# Patient Record
Sex: Male | Born: 1975 | Race: Black or African American | Hispanic: No | Marital: Single | Smoking: Current every day smoker
Health system: Southern US, Community
[De-identification: ages and names within clinical notes are randomized; demographics above are authoritative.]

## PROBLEM LIST (undated history)

## (undated) DIAGNOSIS — K859 Acute pancreatitis without necrosis or infection, unspecified: Secondary | ICD-10-CM

## (undated) HISTORY — PX: NO PAST SURGERIES: SHX2092

## (undated) HISTORY — PX: ANKLE FRACTURE SURGERY: SHX122

---

## 2005-11-19 ENCOUNTER — Inpatient Hospital Stay (HOSPITAL_COMMUNITY): Admission: EM | Admit: 2005-11-19 | Discharge: 2005-11-22 | Payer: Self-pay | Admitting: Emergency Medicine

## 2006-04-26 ENCOUNTER — Inpatient Hospital Stay (HOSPITAL_COMMUNITY): Admission: AD | Admit: 2006-04-26 | Discharge: 2006-04-28 | Payer: Self-pay | Admitting: Internal Medicine

## 2006-06-14 ENCOUNTER — Emergency Department (HOSPITAL_COMMUNITY): Admission: EM | Admit: 2006-06-14 | Discharge: 2006-06-14 | Payer: Self-pay | Admitting: Emergency Medicine

## 2006-07-10 ENCOUNTER — Inpatient Hospital Stay (HOSPITAL_COMMUNITY): Admission: EM | Admit: 2006-07-10 | Discharge: 2006-07-13 | Payer: Self-pay | Admitting: Emergency Medicine

## 2006-07-10 ENCOUNTER — Ambulatory Visit: Payer: Self-pay | Admitting: Hospitalist

## 2006-10-09 ENCOUNTER — Emergency Department (HOSPITAL_COMMUNITY): Admission: EM | Admit: 2006-10-09 | Discharge: 2006-10-09 | Payer: Self-pay | Admitting: Emergency Medicine

## 2006-11-24 ENCOUNTER — Inpatient Hospital Stay (HOSPITAL_COMMUNITY): Admission: EM | Admit: 2006-11-24 | Discharge: 2006-11-28 | Payer: Self-pay | Admitting: Emergency Medicine

## 2007-05-09 ENCOUNTER — Emergency Department (HOSPITAL_COMMUNITY): Admission: EM | Admit: 2007-05-09 | Discharge: 2007-05-09 | Payer: Self-pay | Admitting: Emergency Medicine

## 2007-05-11 ENCOUNTER — Inpatient Hospital Stay (HOSPITAL_COMMUNITY): Admission: EM | Admit: 2007-05-11 | Discharge: 2007-05-14 | Payer: Self-pay | Admitting: *Deleted

## 2007-05-18 ENCOUNTER — Emergency Department (HOSPITAL_COMMUNITY): Admission: EM | Admit: 2007-05-18 | Discharge: 2007-05-18 | Payer: Self-pay | Admitting: Emergency Medicine

## 2007-05-20 ENCOUNTER — Inpatient Hospital Stay (HOSPITAL_COMMUNITY): Admission: EM | Admit: 2007-05-20 | Discharge: 2007-05-26 | Payer: Self-pay | Admitting: Emergency Medicine

## 2007-08-29 ENCOUNTER — Inpatient Hospital Stay (HOSPITAL_COMMUNITY): Admission: EM | Admit: 2007-08-29 | Discharge: 2007-09-02 | Payer: Self-pay | Admitting: Emergency Medicine

## 2007-10-15 ENCOUNTER — Inpatient Hospital Stay (HOSPITAL_COMMUNITY): Admission: EM | Admit: 2007-10-15 | Discharge: 2007-10-18 | Payer: Self-pay | Admitting: Emergency Medicine

## 2008-02-09 ENCOUNTER — Inpatient Hospital Stay (HOSPITAL_COMMUNITY): Admission: EM | Admit: 2008-02-09 | Discharge: 2008-02-14 | Payer: Self-pay | Admitting: Emergency Medicine

## 2008-02-25 ENCOUNTER — Inpatient Hospital Stay (HOSPITAL_COMMUNITY): Admission: EM | Admit: 2008-02-25 | Discharge: 2008-03-04 | Payer: Self-pay | Admitting: Emergency Medicine

## 2008-02-29 ENCOUNTER — Encounter: Payer: Self-pay | Admitting: Internal Medicine

## 2008-03-10 ENCOUNTER — Ambulatory Visit: Payer: Self-pay | Admitting: Internal Medicine

## 2008-03-21 DIAGNOSIS — F1010 Alcohol abuse, uncomplicated: Secondary | ICD-10-CM

## 2008-03-21 DIAGNOSIS — I10 Essential (primary) hypertension: Secondary | ICD-10-CM | POA: Insufficient documentation

## 2008-03-21 DIAGNOSIS — K861 Other chronic pancreatitis: Secondary | ICD-10-CM | POA: Insufficient documentation

## 2008-03-21 DIAGNOSIS — F1910 Other psychoactive substance abuse, uncomplicated: Secondary | ICD-10-CM

## 2008-03-22 ENCOUNTER — Ambulatory Visit: Payer: Self-pay | Admitting: Gastroenterology

## 2008-04-06 ENCOUNTER — Inpatient Hospital Stay (HOSPITAL_COMMUNITY): Admission: EM | Admit: 2008-04-06 | Discharge: 2008-04-11 | Payer: Self-pay | Admitting: Emergency Medicine

## 2008-04-28 ENCOUNTER — Inpatient Hospital Stay (HOSPITAL_COMMUNITY): Admission: EM | Admit: 2008-04-28 | Discharge: 2008-05-06 | Payer: Self-pay | Admitting: Emergency Medicine

## 2008-07-28 ENCOUNTER — Emergency Department (HOSPITAL_COMMUNITY): Admission: EM | Admit: 2008-07-28 | Discharge: 2008-07-29 | Payer: Self-pay | Admitting: Emergency Medicine

## 2009-02-01 ENCOUNTER — Inpatient Hospital Stay (HOSPITAL_COMMUNITY): Admission: EM | Admit: 2009-02-01 | Discharge: 2009-02-03 | Payer: Self-pay | Admitting: Emergency Medicine

## 2009-08-13 IMAGING — CR DG CHEST 2V
2 series · 2 of 2 positions shown · non-contrast
Comparison: 05/20/2007

CLINICAL DATA: Pancreatitis

CHEST - 2 VIEW

[w chest pa]
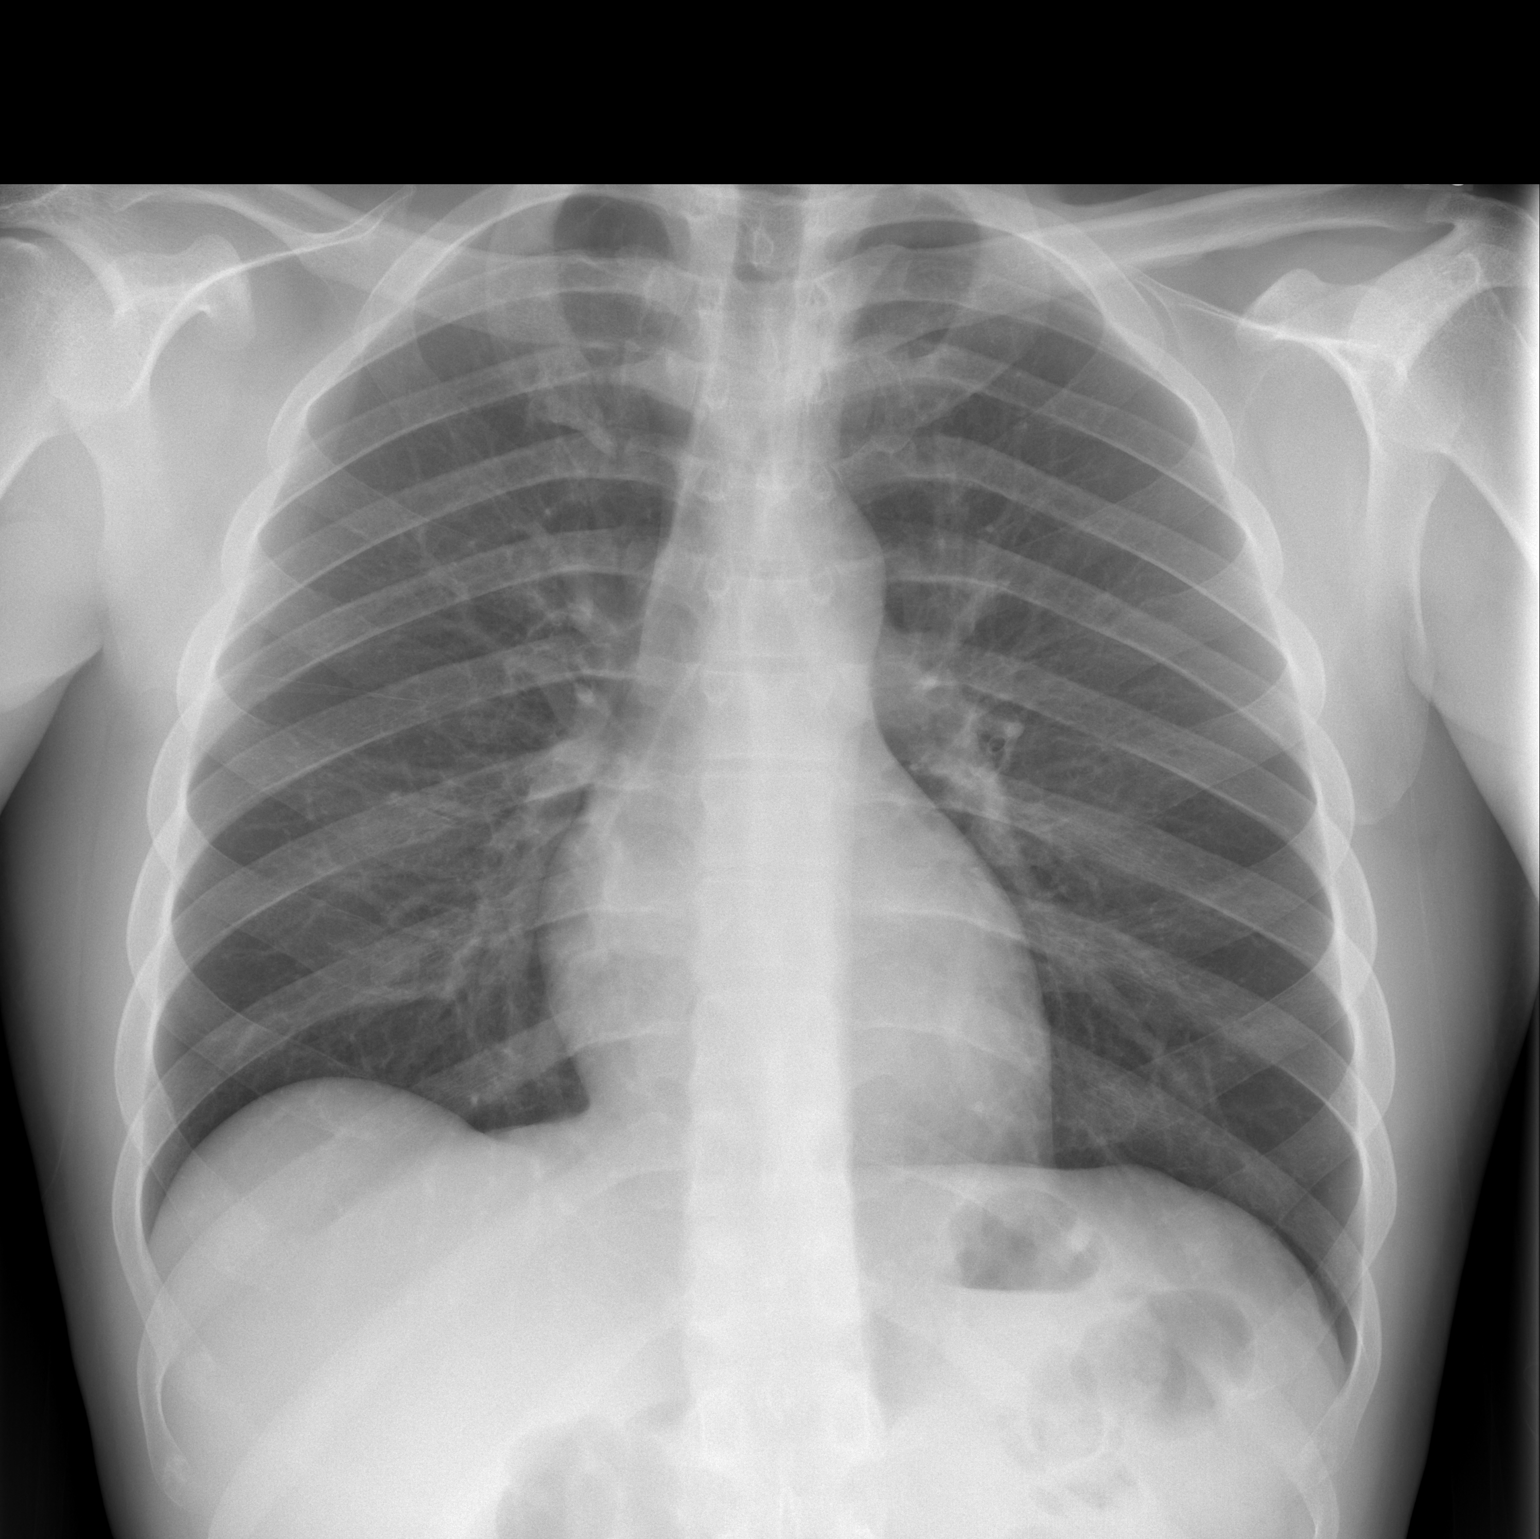

[w chest lat]
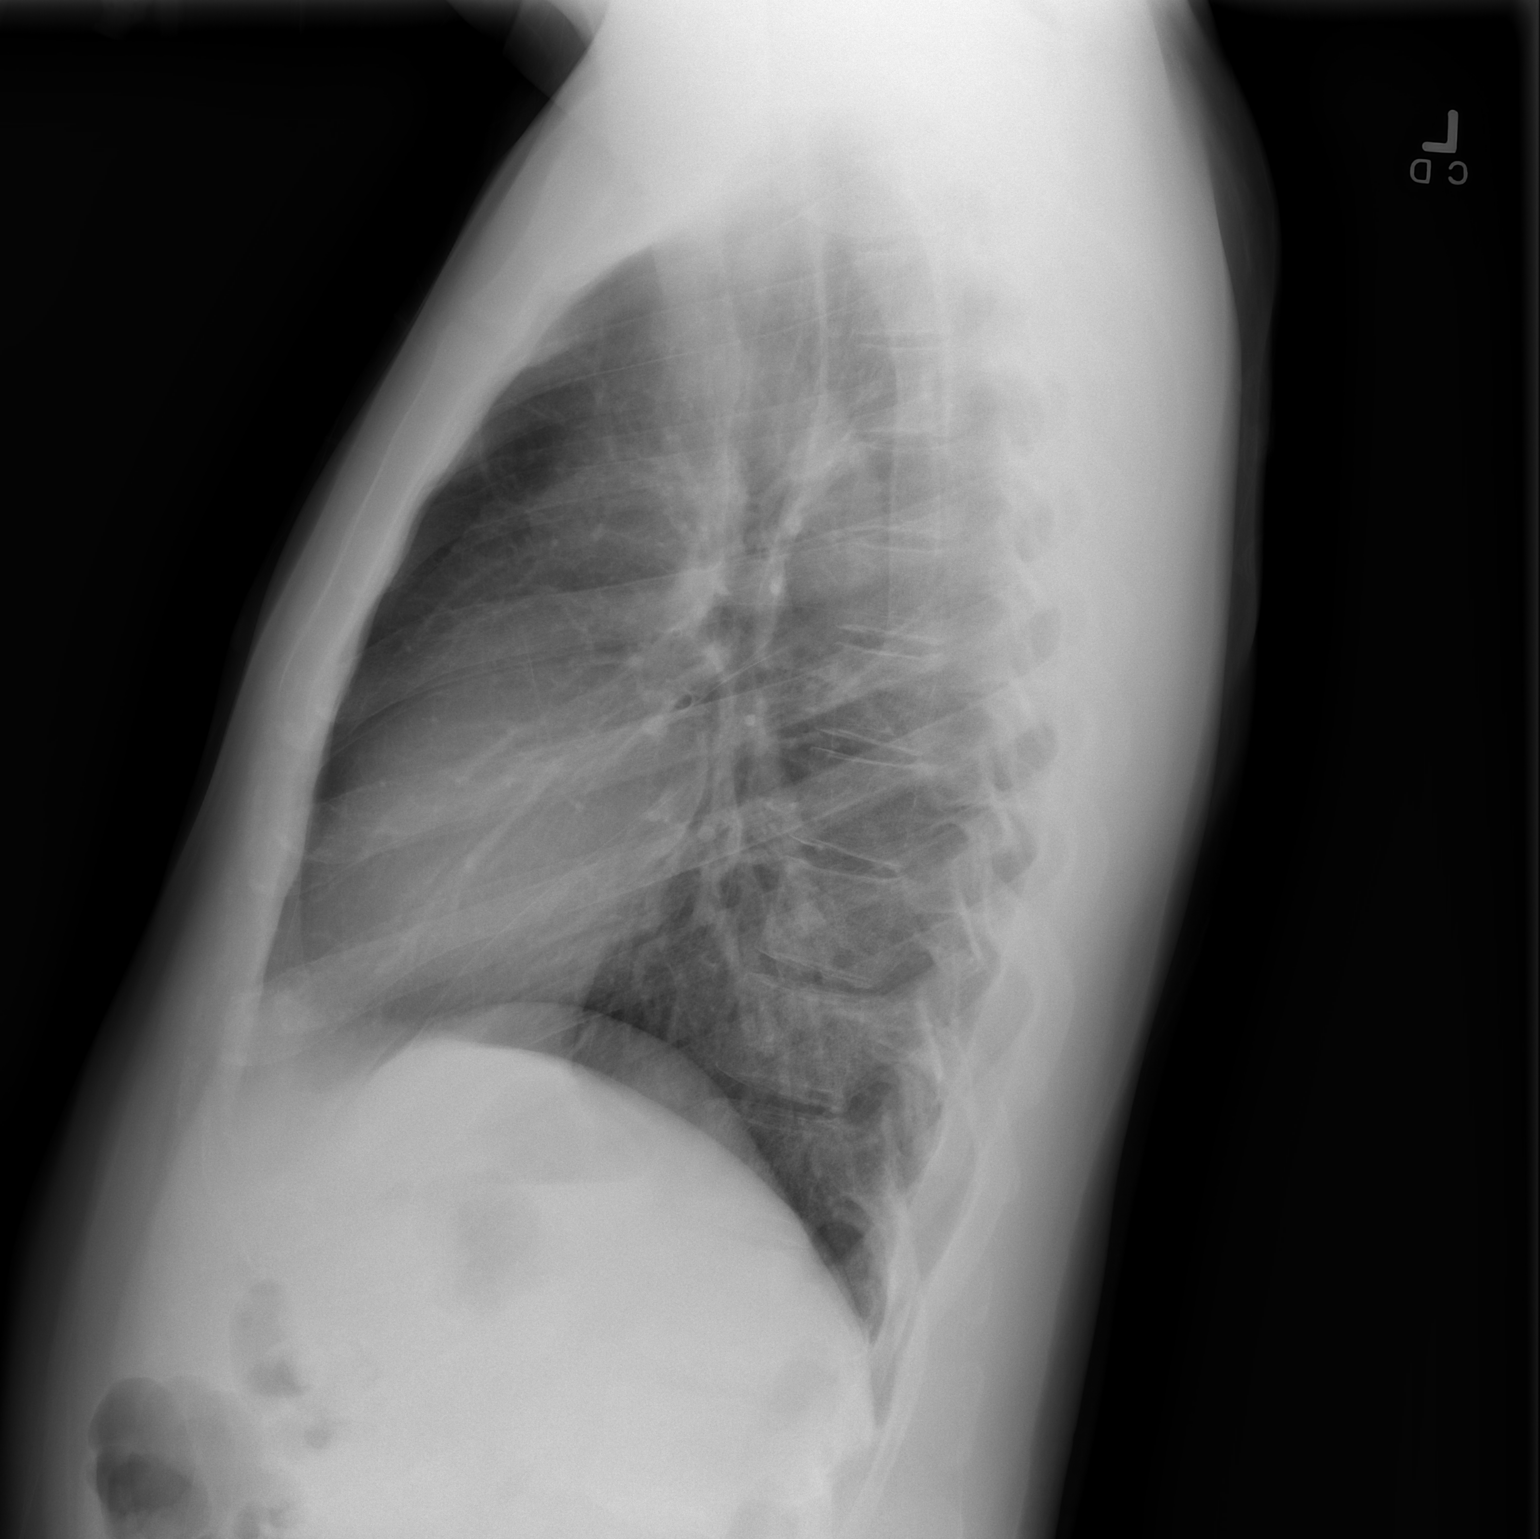

[2 of 2 positions shown; findings below may reference images not displayed]

FINDINGS: The cardiac silhouette, mediastinal and hilar contours
are within normal limits and stable.  The lungs are clear.  The
bony thorax is intact.
IMPRESSION: 1.  No acute cardiopulmonary findings.

## 2009-08-28 ENCOUNTER — Inpatient Hospital Stay (HOSPITAL_COMMUNITY): Admission: EM | Admit: 2009-08-28 | Discharge: 2009-08-31 | Payer: Self-pay | Admitting: Emergency Medicine

## 2009-09-11 IMAGING — RF DG ERCP WO/W SPHINCTEROTOMY
1 series · 6 of 6 positions shown · non-contrast
Comparison: Correlation is made with the CT scan dated May 01, 2008

CLINICAL DATA: Pancreatitis; suspect stone

ERCP

[Series 1: run · 6 of 6 slices shown]
[im 1/6]
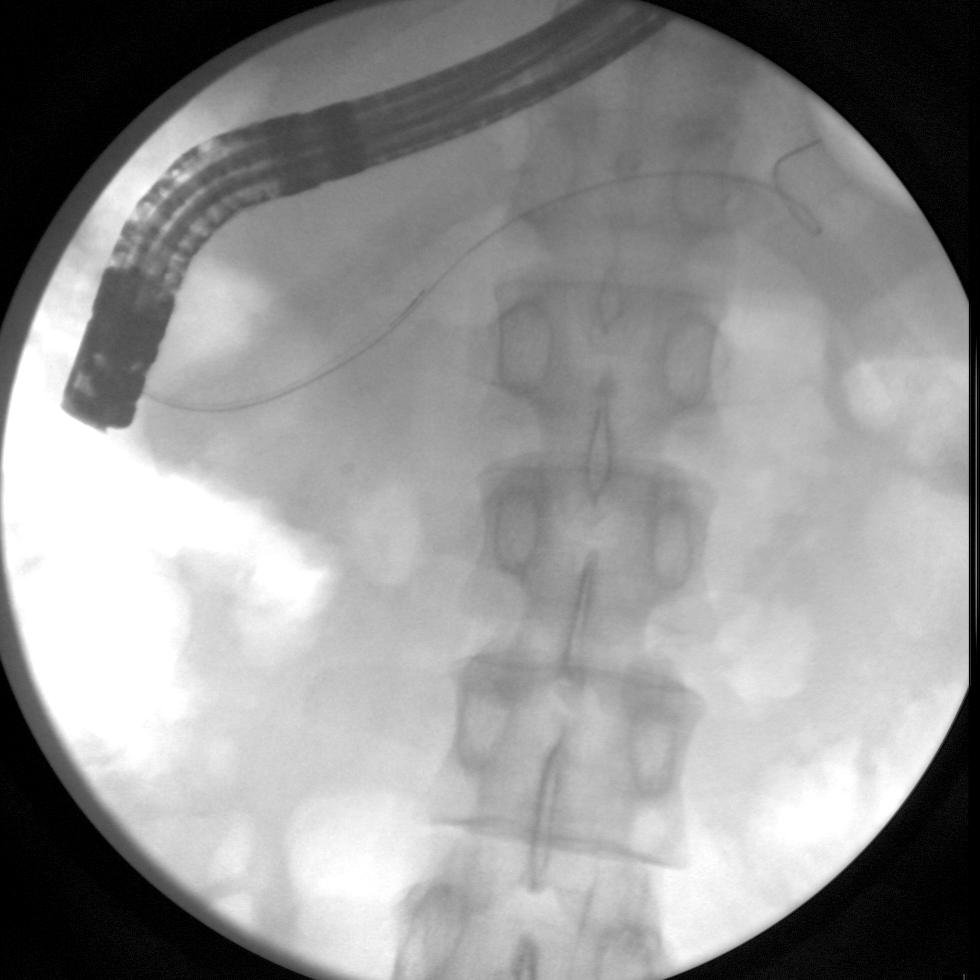
[im 2/6]
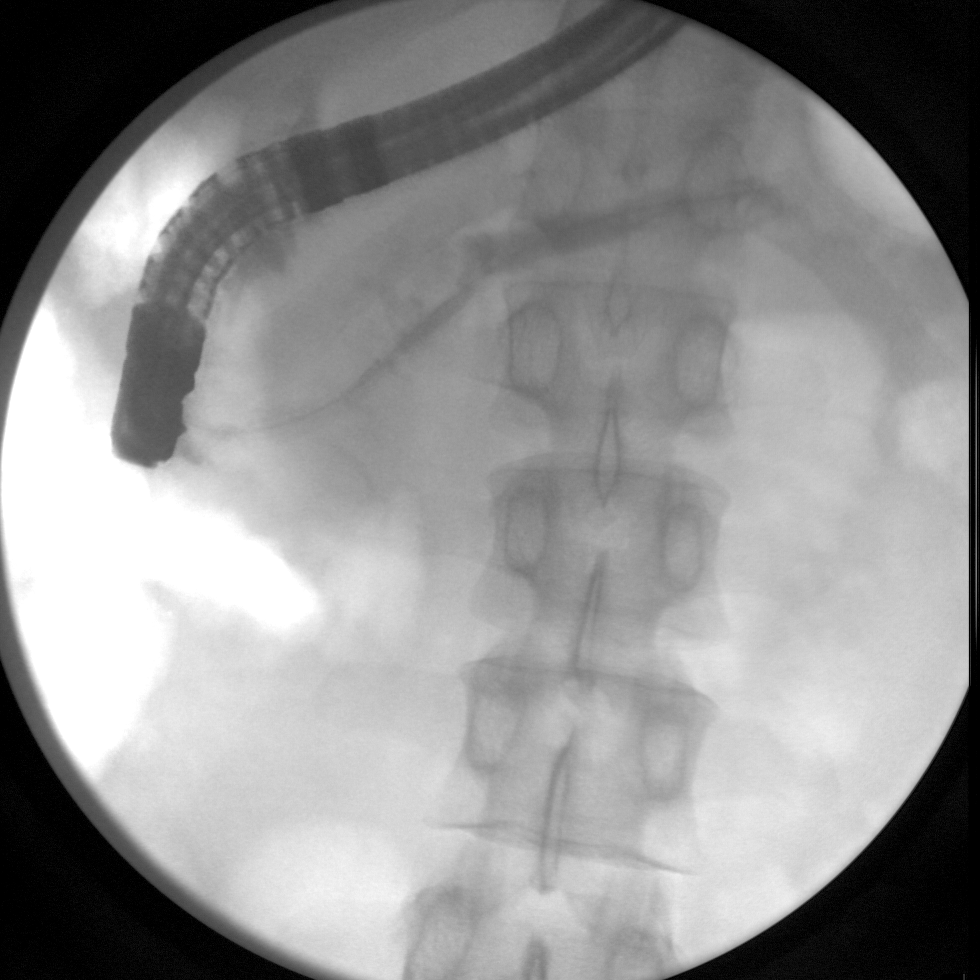
[im 3/6]
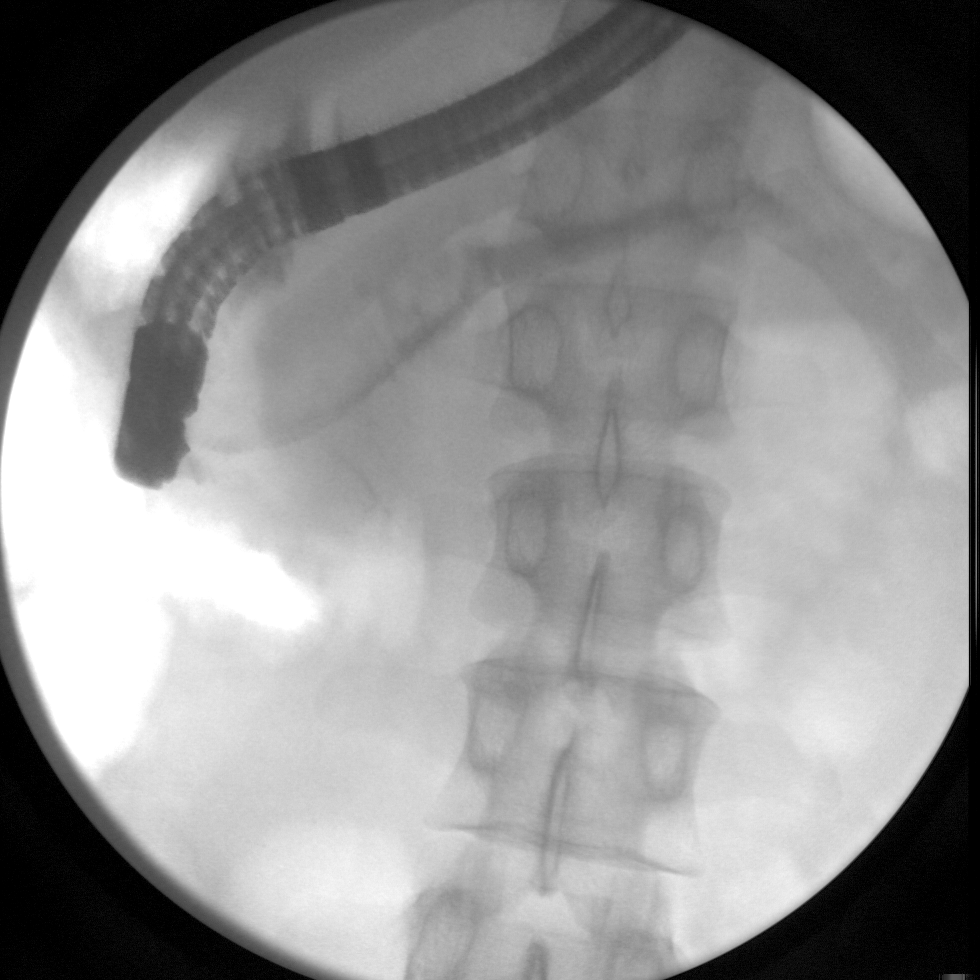
[im 4/6]
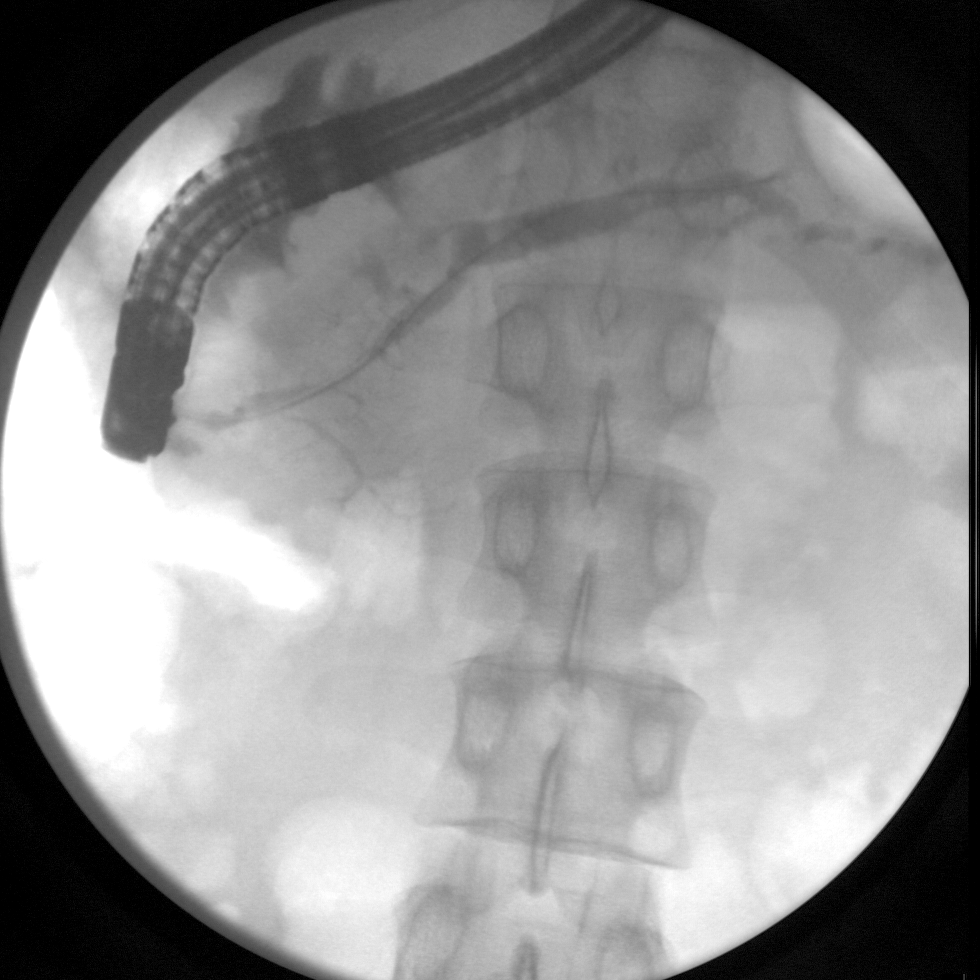
[im 5/6]
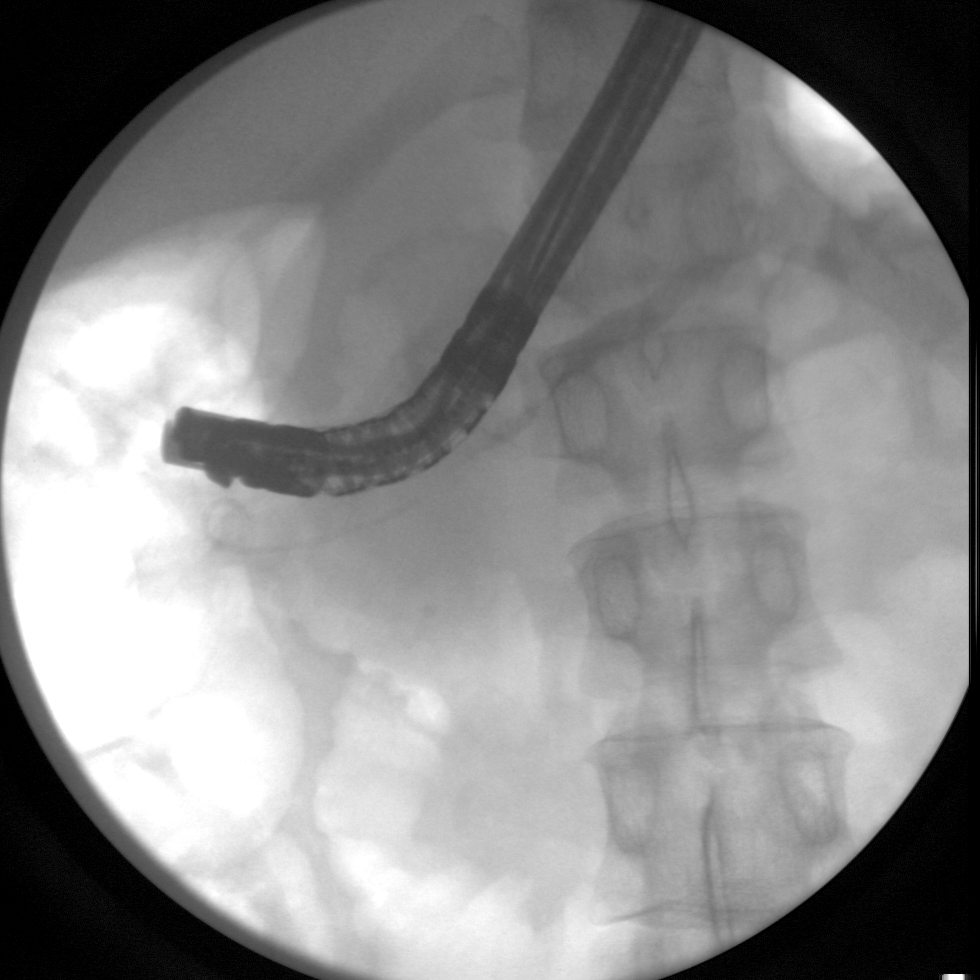
[im 6/6]
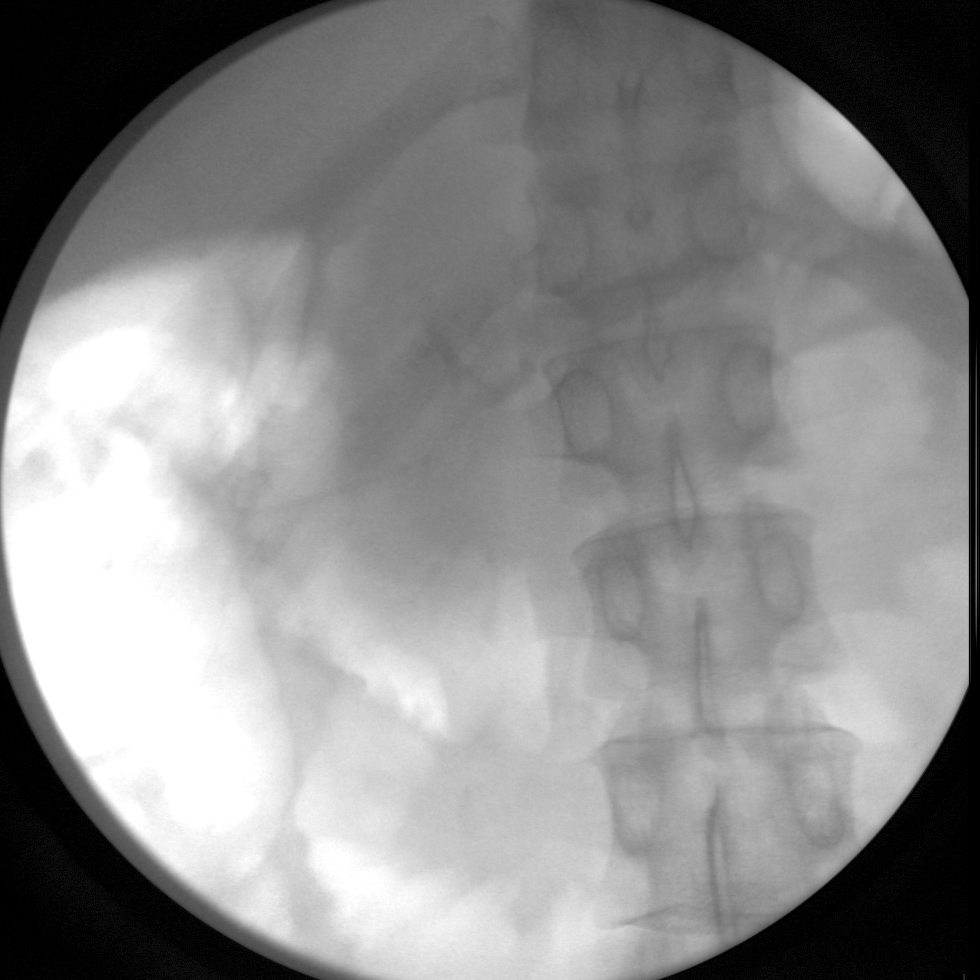

[6 of 6 positions shown; findings below may reference images not displayed]

FINDINGS: An oval filling defect is suspected within the proximal
pancreatic duct ; the pancreatic duct is dilated distal to this.
IMPRESSION: Fixed filling defect in the proximal pancreatic duct correlates
with the calculus seen on CT scan.

## 2009-09-20 ENCOUNTER — Inpatient Hospital Stay (HOSPITAL_COMMUNITY): Admission: EM | Admit: 2009-09-20 | Discharge: 2009-09-22 | Payer: Self-pay | Admitting: Emergency Medicine

## 2009-11-06 ENCOUNTER — Inpatient Hospital Stay (HOSPITAL_COMMUNITY): Admission: EM | Admit: 2009-11-06 | Discharge: 2009-11-09 | Payer: Self-pay | Admitting: Emergency Medicine

## 2009-12-25 ENCOUNTER — Emergency Department (HOSPITAL_COMMUNITY): Admission: EM | Admit: 2009-12-25 | Discharge: 2009-12-25 | Payer: Self-pay | Admitting: Emergency Medicine

## 2010-03-04 ENCOUNTER — Emergency Department (HOSPITAL_COMMUNITY): Admission: EM | Admit: 2010-03-04 | Discharge: 2010-03-04 | Payer: Self-pay | Admitting: Emergency Medicine

## 2010-06-10 IMAGING — CR DG ABDOMEN 2V
2 series · 2 of 2 positions shown · non-contrast
Comparison: 05/01/2008

CLINICAL DATA: Abdominal pain

ABDOMEN - 2 VIEW

[w abdomen upright *]
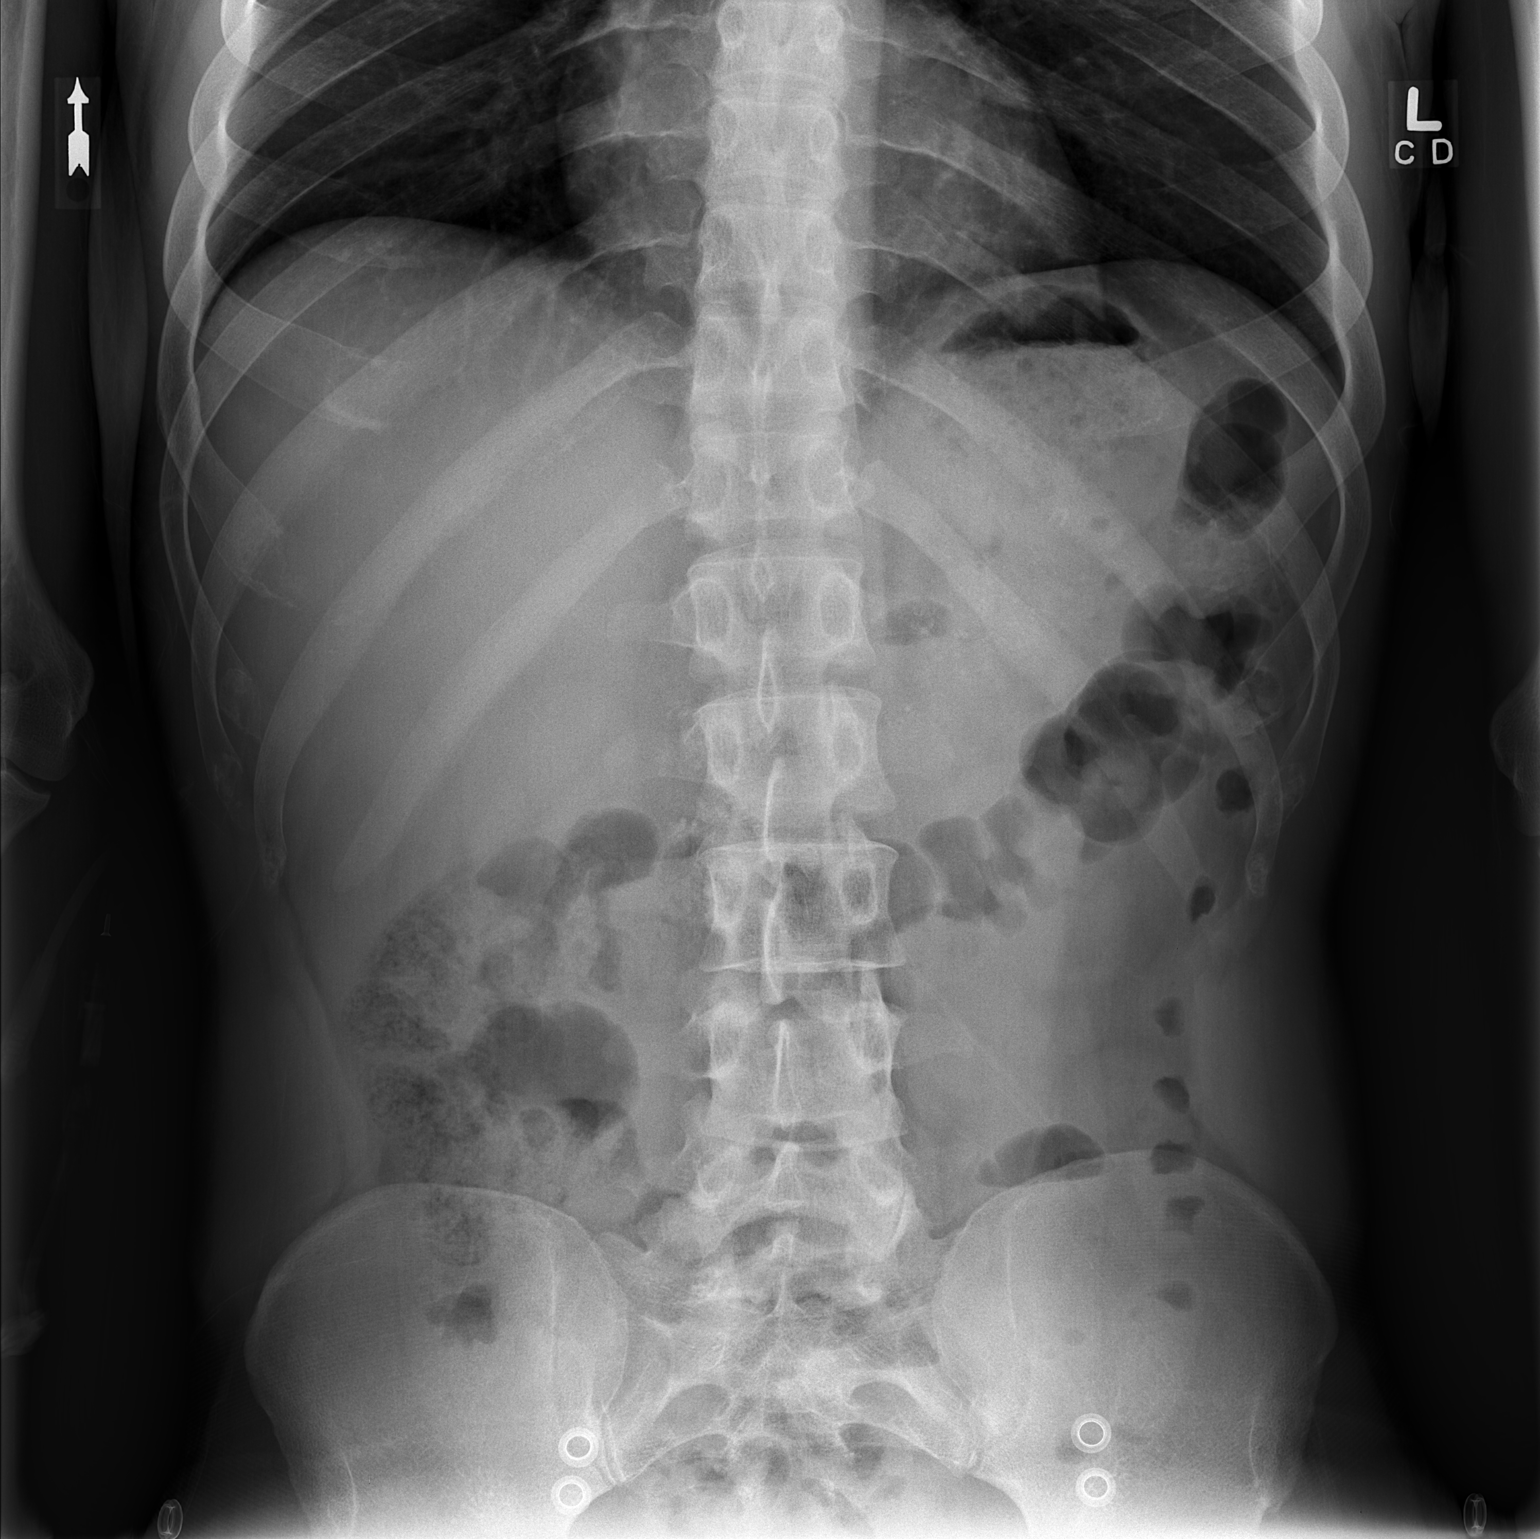

[t abdomen supine]
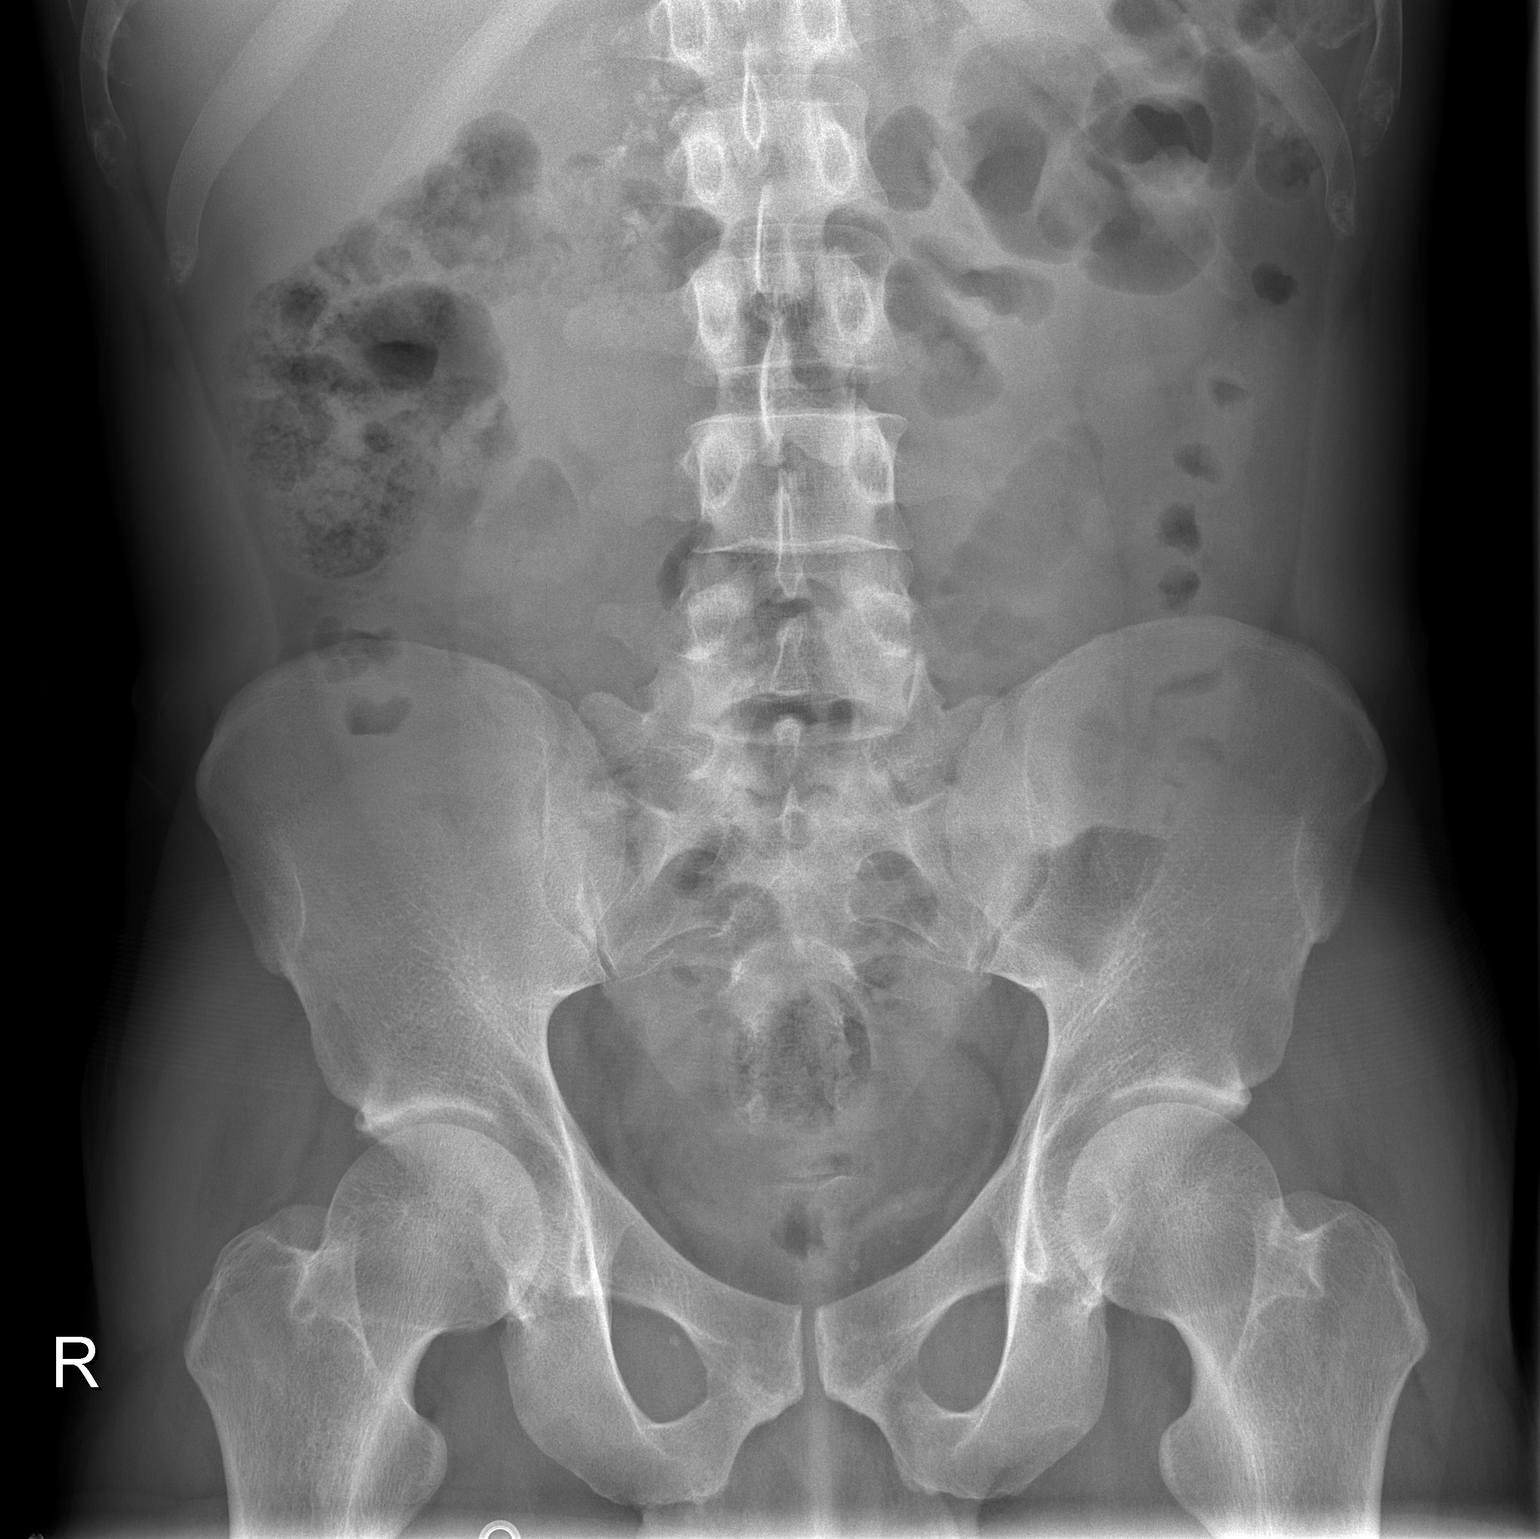

[2 of 2 positions shown; findings below may reference images not displayed]

FINDINGS: Bowel gas pattern is normal without evidence of ileus,
obstruction or free air.  Chronic pancreatic calcifications remain
evident.  No other soft tissue calcifications of note.  Bony
structures are unremarkable.  Lung bases are clear.
IMPRESSION: No acute finding.  Chronic pancreatic calcifications.

## 2010-06-11 IMAGING — US US ABDOMEN COMPLETE
1 series · 14 of 25 positions shown · non-contrast
Comparison: CT 05/01/2008.

CLINICAL DATA: Right upper quadrant pain.  History pancreatitis.

COMPLETE ABDOMINAL ULTRASOUND

[Series 1: us abdomen complete · 0.30mm/px · 14 of 75 slices shown]
[im 1/75]
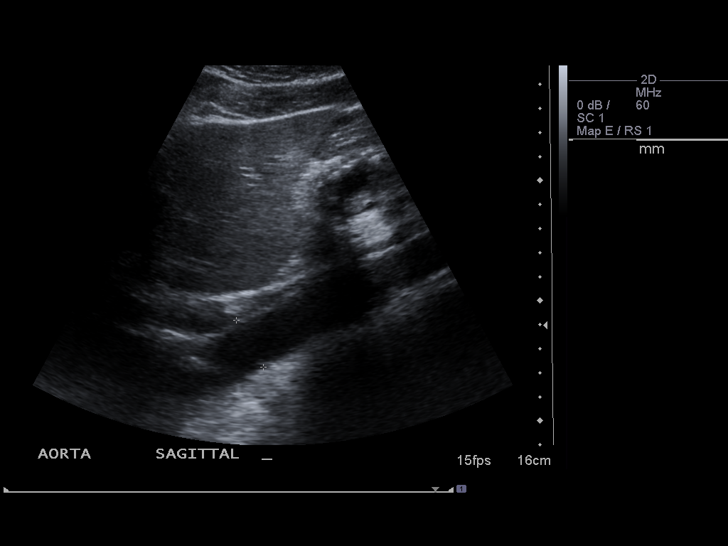
[im 7/75]
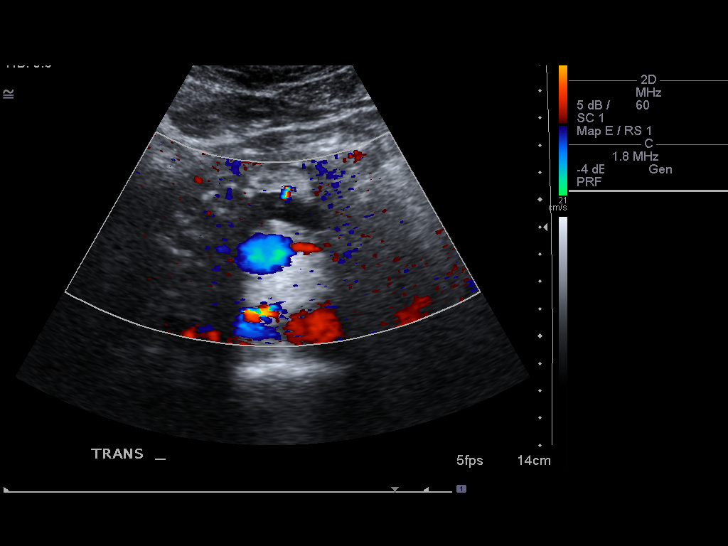
[im 13/75]
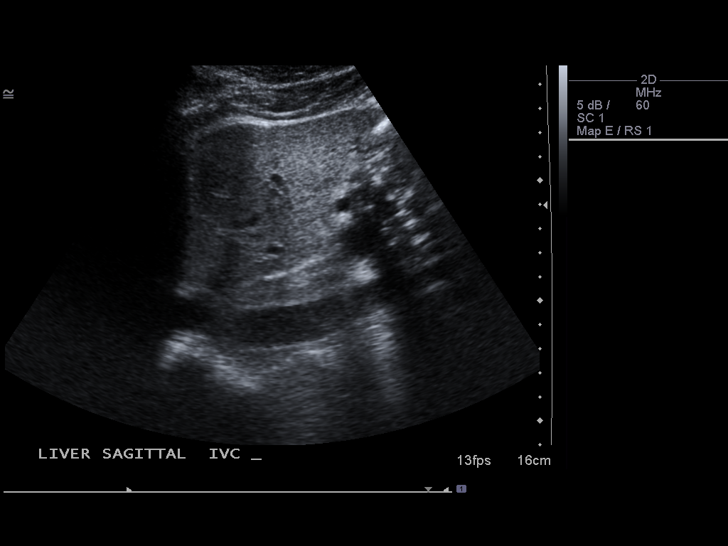
[im 19/75]
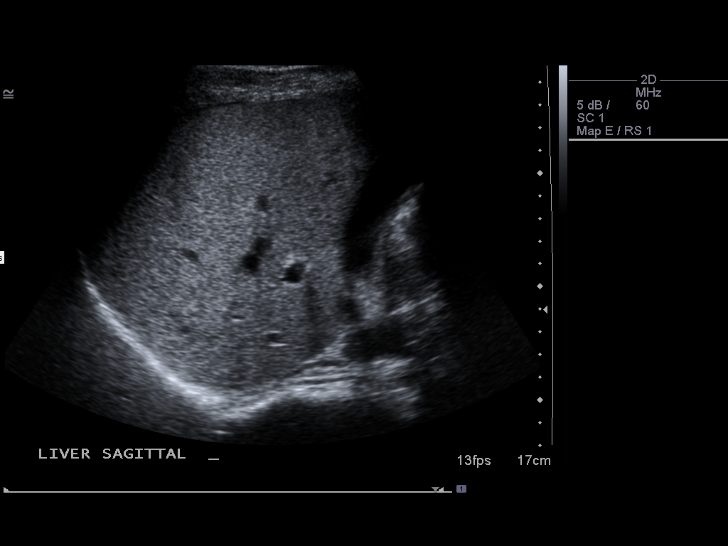
[im 25/75]
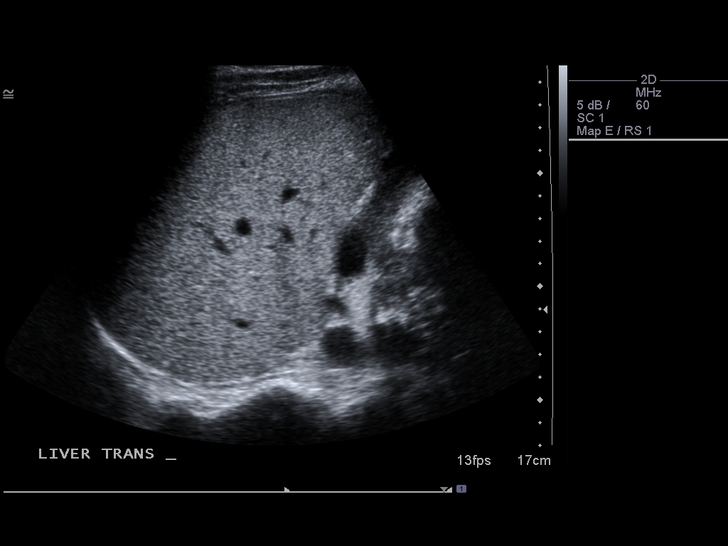
[im 28/75]
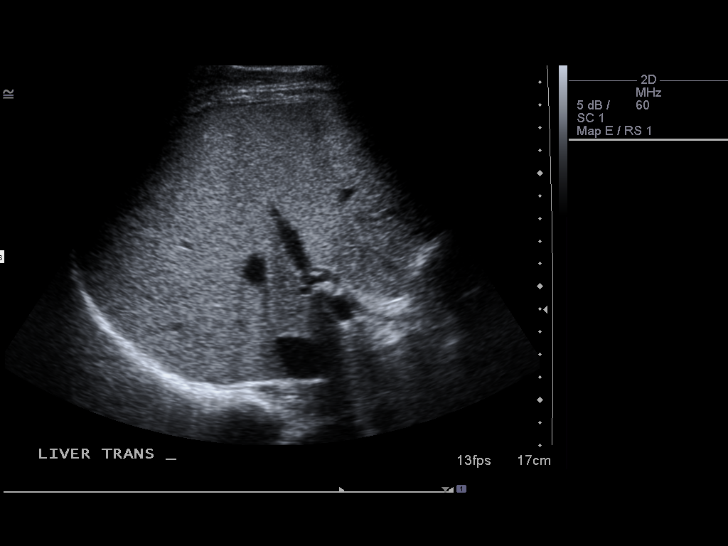
[im 34/75]
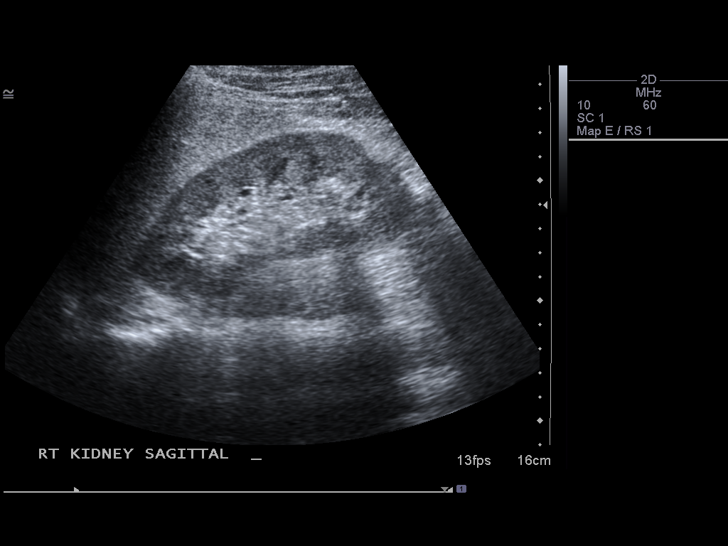
[im 41/75]
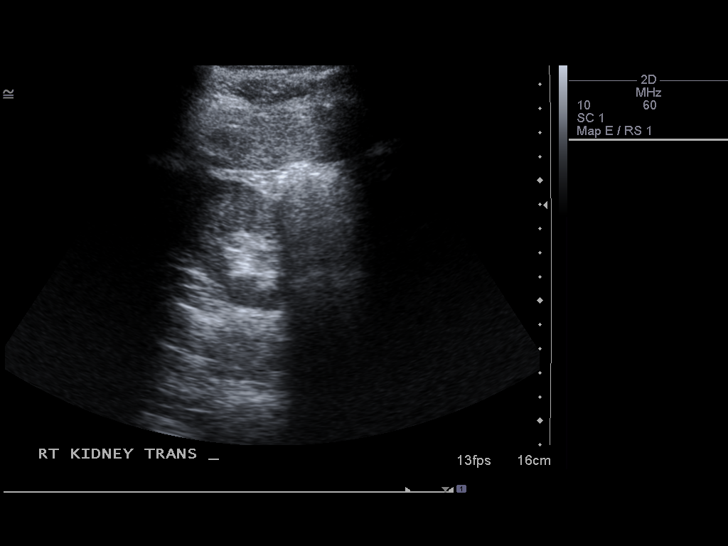
[im 47/75]
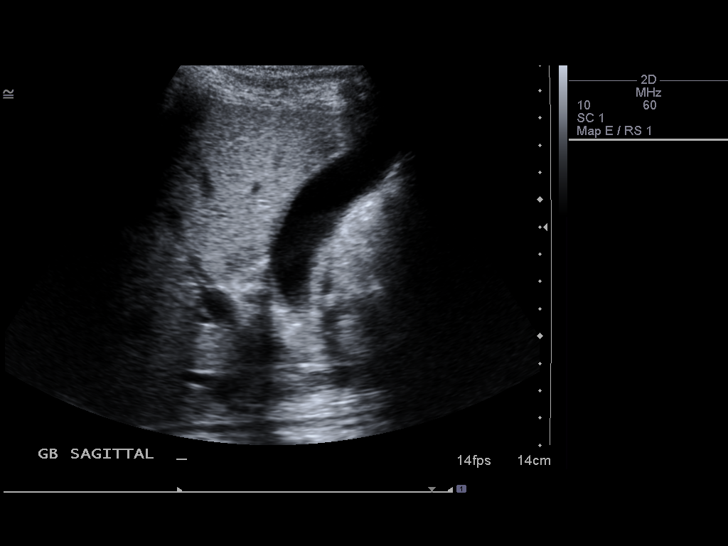
[im 50/75]
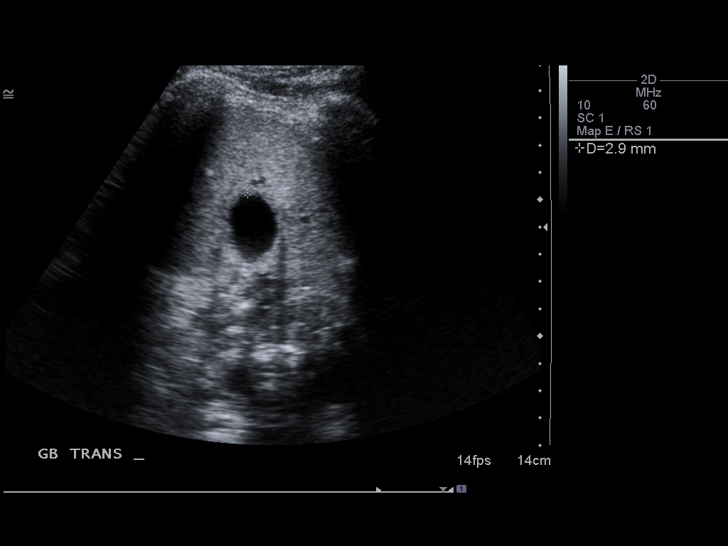
[im 56/75]
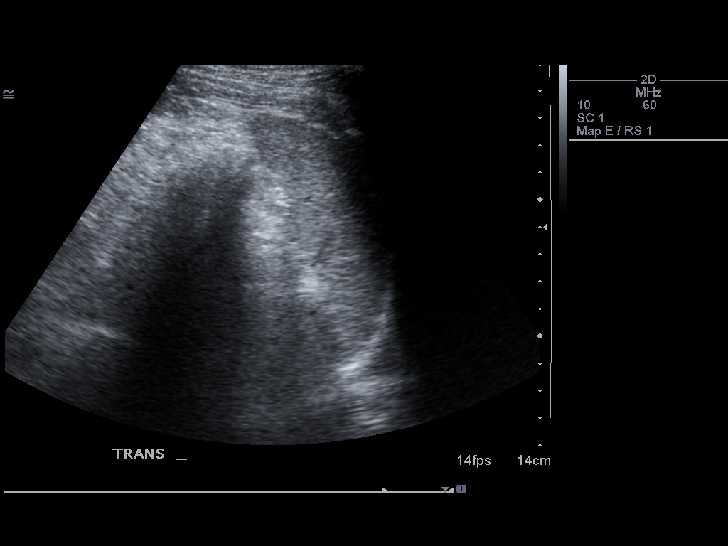
[im 62/75]
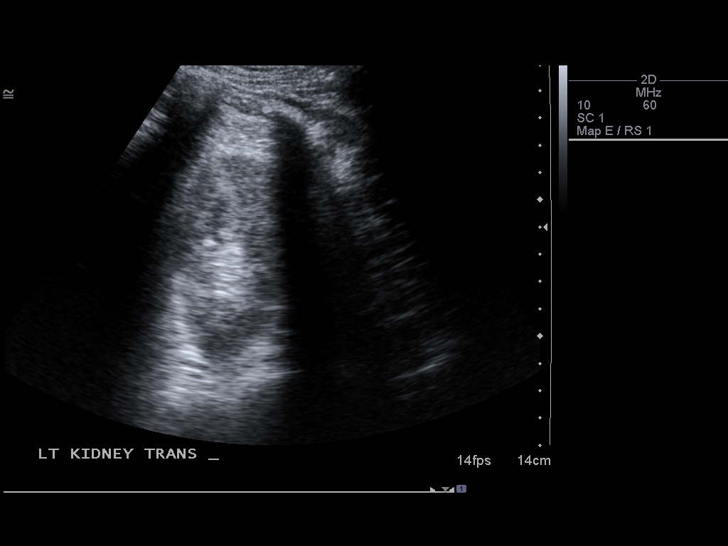
[im 68/75]
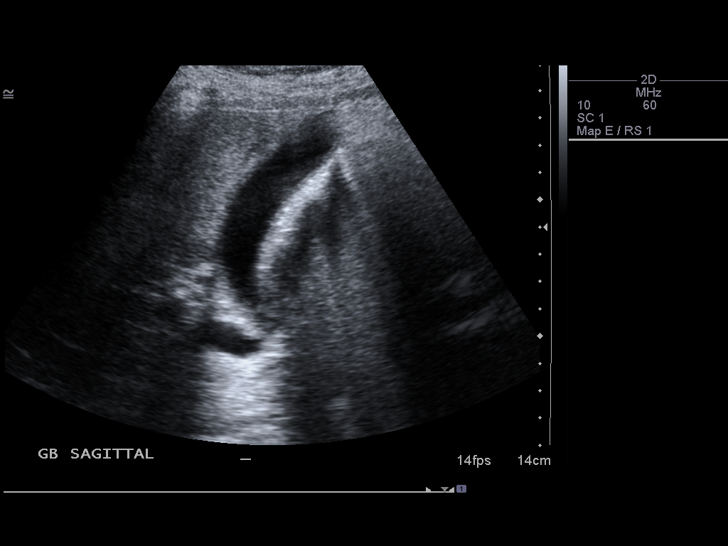
[im 75/75]
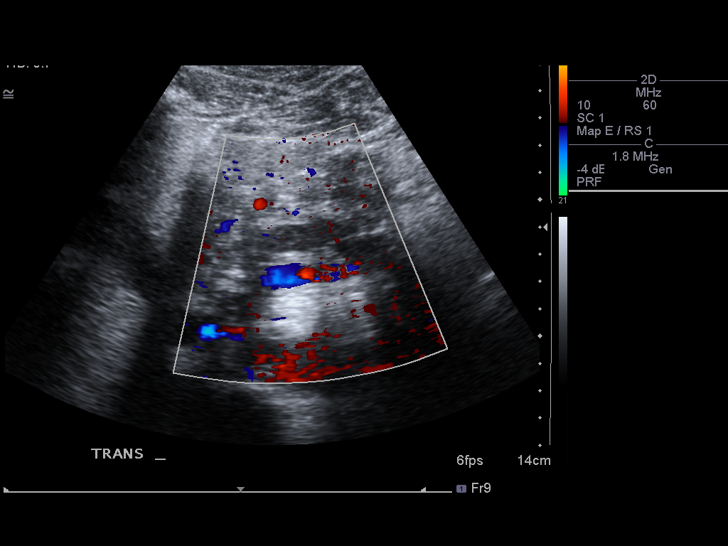

[14 of 25 positions shown; findings below may reference images not displayed]

FINDINGS: Gallbladder:  No gallstones.  Gallbladder wall mildly thickened at
3.1 mm.  No pericholecystic fluid.

Common bile duct:  6 mm.

Liver:  Mild fatty infiltration.  No focal hepatic lesion or
intrahepatic biliary duct dilatation.

IVC:  Negative.

Pancreas:  Changes of chronic pancreatitis with calcifications.
Dilated pancreatic duct up to 1 cm.  On the prior CT, stone was
seen within the dilated pancreatic duct at the level of the
pancreatic head.  Evaluation of this level is limited by overlying
bowel gas.

Spleen:  7.2 cm.  No focal mass.

Right Kidney:  12.1 cm. No hydronephrosis or renal mass.

Left Kidney:  12.1 cm. No hydronephrosis or renal mass. There

Abdominal aorta:  And 2.3 cm maximal AP dimension.
IMPRESSION: Changes of chronic pancreatitis with calcifications and dilated
pancreatic duct as discussed above.

Gallbladder wall thickness minimally prominent at 3.1 mm.  No
gallstones or pericholecystic fluid detected.

## 2010-10-05 LAB — COMPREHENSIVE METABOLIC PANEL
Alkaline Phosphatase: 75 U/L (ref 39–117)
BUN: 7 mg/dL (ref 6–23)
Calcium: 8.9 mg/dL (ref 8.4–10.5)
GFR calc non Af Amer: >60 mL/min (ref >60)
Glucose, Bld: 114 mg/dL — ABNORMAL HIGH (ref 70–99)
Potassium: 3.7 mEq/L (ref 3.5–5.1)
Total Protein: 7.1 g/dL (ref 6.0–8.3)

## 2010-10-05 LAB — LIPASE, BLOOD: Lipase: 79 U/L — ABNORMAL HIGH (ref 11–59)

## 2010-10-09 LAB — BASIC METABOLIC PANEL
Chloride: 109 mEq/L (ref 96–112)
GFR calc non Af Amer: >60 mL/min (ref >60)
Potassium: 3.9 mEq/L (ref 3.5–5.1)
Sodium: 140 mEq/L (ref 135–145)

## 2010-10-09 LAB — COMPREHENSIVE METABOLIC PANEL
ALT: 35 U/L (ref 0–53)
AST: 39 U/L — ABNORMAL HIGH (ref 0–37)
Albumin: 4.2 g/dL (ref 3.5–5.2)
Alkaline Phosphatase: 54 U/L (ref 39–117)
BUN: 11 mg/dL (ref 6–23)
Chloride: 104 mEq/L (ref 96–112)
GFR calc Af Amer: >60 mL/min (ref >60)
Potassium: 3.9 mEq/L (ref 3.5–5.1)
Sodium: 136 mEq/L (ref 135–145)
Total Bilirubin: 0.8 mg/dL (ref 0.3–1.2)

## 2010-10-09 LAB — CBC
HCT: 34.8 % — ABNORMAL LOW (ref 39.0–52.0)
HCT: 42.1 % (ref 39.0–52.0)
Hemoglobin: 11.6 g/dL — ABNORMAL LOW (ref 13.0–17.0)
MCV: 97 fL (ref 78.0–100.0)
Platelets: 169 10*3/uL (ref 150–400)
Platelets: 214 10*3/uL (ref 150–400)
RBC: 3.59 MIL/uL — ABNORMAL LOW (ref 4.22–5.81)
WBC: 5.6 10*3/uL (ref 4.0–10.5)
WBC: 7.9 10*3/uL (ref 4.0–10.5)

## 2010-10-09 LAB — HEPATIC FUNCTION PANEL
Albumin: 3.1 g/dL — ABNORMAL LOW (ref 3.5–5.2)
Alkaline Phosphatase: 49 U/L (ref 39–117)
Indirect Bilirubin: 0.3 mg/dL (ref 0.3–0.9)
Total Protein: 6.2 g/dL (ref 6.0–8.3)

## 2010-10-09 LAB — CK TOTAL AND CKMB (NOT AT ARMC)
CK, MB: 0.9 ng/mL (ref 0.3–4.0)
Total CK: 301 U/L — ABNORMAL HIGH (ref 7–232)

## 2010-10-09 LAB — DIFFERENTIAL
Basophils Absolute: 0 10*3/uL (ref 0.0–0.1)
Basophils Relative: 0 % (ref 0–1)
Eosinophils Absolute: 0.2 10*3/uL (ref 0.0–0.7)
Eosinophils Relative: 2 % (ref 0–5)
Monocytes Absolute: 0.9 10*3/uL (ref 0.1–1.0)
Monocytes Relative: 12 % (ref 3–12)

## 2010-10-09 LAB — URINALYSIS, ROUTINE W REFLEX MICROSCOPIC
Glucose, UA: NEGATIVE mg/dL
Hgb urine dipstick: NEGATIVE
Protein, ur: 100 mg/dL — AB
Urobilinogen, UA: 1 mg/dL (ref 0.0–1.0)

## 2010-10-09 LAB — LIPID PANEL
Cholesterol: 144 mg/dL (ref 0–200)
HDL: 33 mg/dL — ABNORMAL LOW (ref >39)
Total CHOL/HDL Ratio: 4.4 RATIO

## 2010-10-09 LAB — CARDIAC PANEL(CRET KIN+CKTOT+MB+TROPI)
CK, MB: 1 ng/mL (ref 0.3–4.0)
Relative Index: 0.3 (ref 0.0–2.5)
Total CK: 244 U/L — ABNORMAL HIGH (ref 7–232)
Total CK: 269 U/L — ABNORMAL HIGH (ref 7–232)
Total CK: 299 U/L — ABNORMAL HIGH (ref 7–232)
Troponin I: 0.01 ng/mL (ref 0.00–0.06)
Troponin I: 0.02 ng/mL (ref 0.00–0.06)
Troponin I: 0.03 ng/mL (ref 0.00–0.06)

## 2010-10-09 LAB — TROPONIN I: Troponin I: 0.02 ng/mL (ref 0.00–0.06)

## 2010-10-09 LAB — LIPASE, BLOOD: Lipase: 49 U/L (ref 11–59)

## 2010-10-09 LAB — URINE MICROSCOPIC-ADD ON

## 2010-10-10 LAB — COMPREHENSIVE METABOLIC PANEL
ALT: 22 U/L (ref 0–53)
ALT: 26 U/L (ref 0–53)
AST: 27 U/L (ref 0–37)
AST: 38 U/L — ABNORMAL HIGH (ref 0–37)
Albumin: 4.4 g/dL (ref 3.5–5.2)
Alkaline Phosphatase: 47 U/L (ref 39–117)
BUN: 9 mg/dL (ref 6–23)
CO2: 26 mEq/L (ref 19–32)
Calcium: 8.6 mg/dL (ref 8.4–10.5)
Chloride: 104 mEq/L (ref 96–112)
Chloride: 105 mEq/L (ref 96–112)
GFR calc Af Amer: >60 mL/min (ref >60)
GFR calc non Af Amer: >60 mL/min (ref >60)
Glucose, Bld: 89 mg/dL (ref 70–99)
Potassium: 4.1 mEq/L (ref 3.5–5.1)
Sodium: 136 mEq/L (ref 135–145)
Sodium: 138 mEq/L (ref 135–145)
Total Bilirubin: 0.5 mg/dL (ref 0.3–1.2)
Total Bilirubin: 0.7 mg/dL (ref 0.3–1.2)

## 2010-10-10 LAB — LIPID PANEL
Cholesterol: 173 mg/dL (ref 0–200)
HDL: 42 mg/dL (ref >39)
LDL Cholesterol: 113 mg/dL — ABNORMAL HIGH (ref 0–99)
Total CHOL/HDL Ratio: 4.1 RATIO

## 2010-10-10 LAB — GLUCOSE, CAPILLARY: Glucose-Capillary: 111 mg/dL — ABNORMAL HIGH (ref 70–99)

## 2010-10-10 LAB — CBC
HCT: 39.2 % (ref 39.0–52.0)
Hemoglobin: 12.1 g/dL — ABNORMAL LOW (ref 13.0–17.0)
MCHC: 33.9 g/dL (ref 30.0–36.0)
MCV: 96.5 fL (ref 78.0–100.0)
Platelets: 176 10*3/uL (ref 150–400)
RBC: 3.69 MIL/uL — ABNORMAL LOW (ref 4.22–5.81)
WBC: 5.5 10*3/uL (ref 4.0–10.5)
WBC: 8 10*3/uL (ref 4.0–10.5)

## 2010-10-10 LAB — DIFFERENTIAL
Basophils Absolute: 0 10*3/uL (ref 0.0–0.1)
Basophils Relative: 1 % (ref 0–1)
Eosinophils Absolute: 0 10*3/uL (ref 0.0–0.7)
Eosinophils Relative: 0 % (ref 0–5)
Monocytes Absolute: 0.6 10*3/uL (ref 0.1–1.0)

## 2010-10-10 LAB — OPIATE, QUANTITATIVE, URINE
Hydromorphone GC/MS Conf: NEGATIVE NG/ML
Oxymorphone: NEGATIVE NG/ML

## 2010-10-10 LAB — TSH: TSH: 3.645 u[IU]/mL (ref 0.350–4.500)

## 2010-10-10 LAB — CARDIAC PANEL(CRET KIN+CKTOT+MB+TROPI)
CK, MB: 1.2 ng/mL (ref 0.3–4.0)
CK, MB: 1.4 ng/mL (ref 0.3–4.0)
Relative Index: 0.3 (ref 0.0–2.5)
Total CK: 451 U/L — ABNORMAL HIGH (ref 7–232)
Troponin I: <0.01 ng/mL (ref 0.00–0.06)

## 2010-10-10 LAB — URINE DRUGS OF ABUSE SCREEN W ALC, ROUTINE (REF LAB)
Amphetamine Screen, Ur: NEGATIVE
Cocaine Metabolites: POSITIVE — AB
Creatinine,U: 111.2 mg/dL
Ethyl Alcohol: <10 mg/dL (ref <10)
Marijuana Metabolite: NEGATIVE
Opiate Screen, Urine: POSITIVE — AB
Propoxyphene: NEGATIVE

## 2010-10-10 LAB — COCAINE, URINE, CONFIRMATION: Benzoylecgonine GC/MS Conf: 584 NG/ML — ABNORMAL HIGH

## 2010-10-14 LAB — CBC
HCT: 39.2 % (ref 39.0–52.0)
MCHC: 32.9 g/dL (ref 30.0–36.0)
MCHC: 34.1 g/dL (ref 30.0–36.0)
Platelets: 182 10*3/uL (ref 150–400)
Platelets: 209 10*3/uL (ref 150–400)
Platelets: 224 10*3/uL (ref 150–400)
RBC: 4.07 MIL/uL — ABNORMAL LOW (ref 4.22–5.81)
RDW: 13.8 % (ref 11.5–15.5)
RDW: 14.1 % (ref 11.5–15.5)
WBC: 8.5 10*3/uL (ref 4.0–10.5)

## 2010-10-14 LAB — COMPREHENSIVE METABOLIC PANEL
ALT: 29 U/L (ref 0–53)
ALT: 39 U/L (ref 0–53)
AST: 36 U/L (ref 0–37)
Albumin: 3.4 g/dL — ABNORMAL LOW (ref 3.5–5.2)
Albumin: 4.1 g/dL (ref 3.5–5.2)
Albumin: 4.4 g/dL (ref 3.5–5.2)
Alkaline Phosphatase: 44 U/L (ref 39–117)
Alkaline Phosphatase: 49 U/L (ref 39–117)
CO2: 25 mEq/L (ref 19–32)
Calcium: 8.7 mg/dL (ref 8.4–10.5)
Calcium: 9.7 mg/dL (ref 8.4–10.5)
Chloride: 104 mEq/L (ref 96–112)
GFR calc Af Amer: >60 mL/min (ref >60)
GFR calc Af Amer: >60 mL/min (ref >60)
GFR calc Af Amer: >60 mL/min (ref >60)
GFR calc non Af Amer: >60 mL/min (ref >60)
Glucose, Bld: 112 mg/dL — ABNORMAL HIGH (ref 70–99)
Glucose, Bld: 73 mg/dL (ref 70–99)
Potassium: 3.6 mEq/L (ref 3.5–5.1)
Potassium: 3.9 mEq/L (ref 3.5–5.1)
Sodium: 136 mEq/L (ref 135–145)
Sodium: 140 mEq/L (ref 135–145)
Total Bilirubin: 1.1 mg/dL (ref 0.3–1.2)
Total Protein: 6.6 g/dL (ref 6.0–8.3)
Total Protein: 8.5 g/dL — ABNORMAL HIGH (ref 6.0–8.3)

## 2010-10-14 LAB — URINALYSIS, ROUTINE W REFLEX MICROSCOPIC
Glucose, UA: NEGATIVE mg/dL
Hgb urine dipstick: NEGATIVE
pH: 6 (ref 5.0–8.0)

## 2010-10-14 LAB — DIFFERENTIAL
Basophils Absolute: 0.1 10*3/uL (ref 0.0–0.1)
Basophils Relative: 0 % (ref 0–1)
Basophils Relative: 1 % (ref 0–1)
Eosinophils Absolute: 0 10*3/uL (ref 0.0–0.7)
Eosinophils Absolute: 0.1 10*3/uL (ref 0.0–0.7)
Eosinophils Relative: 0 % (ref 0–5)
Lymphs Abs: 1.4 10*3/uL (ref 0.7–4.0)
Monocytes Absolute: 0.9 10*3/uL (ref 0.1–1.0)
Monocytes Absolute: 0.9 10*3/uL (ref 0.1–1.0)
Monocytes Relative: 10 % (ref 3–12)
Neutrophils Relative %: 73 % (ref 43–77)

## 2010-10-14 LAB — URINE CULTURE
Colony Count: NO GROWTH
Culture: NO GROWTH

## 2010-10-14 LAB — LIPASE, BLOOD: Lipase: 55 U/L (ref 11–59)

## 2010-10-14 LAB — RAPID URINE DRUG SCREEN, HOSP PERFORMED
Barbiturates: NOT DETECTED
Opiates: POSITIVE — AB

## 2010-10-14 LAB — URINE MICROSCOPIC-ADD ON

## 2010-10-14 LAB — GC/CHLAMYDIA PROBE AMP, URINE
Chlamydia, Swab/Urine, PCR: NEGATIVE
GC Probe Amp, Urine: NEGATIVE

## 2010-10-28 LAB — CBC
HCT: 38.7 % — ABNORMAL LOW (ref 39.0–52.0)
HCT: 43.4 % (ref 39.0–52.0)
Hemoglobin: 13.1 g/dL (ref 13.0–17.0)
MCHC: 33.8 g/dL (ref 30.0–36.0)
MCV: 96.4 fL (ref 78.0–100.0)
Platelets: 222 10*3/uL (ref 150–400)
RBC: 3.97 MIL/uL — ABNORMAL LOW (ref 4.22–5.81)
RBC: 4.5 MIL/uL (ref 4.22–5.81)
RDW: 13.8 % (ref 11.5–15.5)
WBC: 7.7 10*3/uL (ref 4.0–10.5)

## 2010-10-28 LAB — LIPASE, BLOOD
Lipase: 110 U/L — ABNORMAL HIGH (ref 11–59)
Lipase: 52 U/L (ref 11–59)

## 2010-10-28 LAB — COMPREHENSIVE METABOLIC PANEL
Alkaline Phosphatase: 44 U/L (ref 39–117)
BUN: 6 mg/dL (ref 6–23)
BUN: 6 mg/dL (ref 6–23)
CO2: 26 mEq/L (ref 19–32)
CO2: 27 mEq/L (ref 19–32)
Chloride: 105 mEq/L (ref 96–112)
Creatinine, Ser: 1.04 mg/dL (ref 0.4–1.5)
GFR calc non Af Amer: >60 mL/min (ref >60)
GFR calc non Af Amer: >60 mL/min (ref >60)
Glucose, Bld: 100 mg/dL — ABNORMAL HIGH (ref 70–99)
Potassium: 3.9 mEq/L (ref 3.5–5.1)
Total Bilirubin: 0.5 mg/dL (ref 0.3–1.2)
Total Bilirubin: 0.5 mg/dL (ref 0.3–1.2)
Total Protein: 6.4 g/dL (ref 6.0–8.3)

## 2010-10-28 LAB — COCAINE, URINE, CONFIRMATION: Benzoylecgonine GC/MS Conf: 1100 ng/mL

## 2010-10-28 LAB — PROTIME-INR: Prothrombin Time: 13.6 seconds (ref 11.6–15.2)

## 2010-10-28 LAB — DRUGS OF ABUSE SCREEN W/O ALC, ROUTINE URINE
Amphetamine Screen, Ur: NEGATIVE
Barbiturate Quant, Ur: NEGATIVE
Creatinine,U: 88.3 mg/dL
Opiate Screen, Urine: POSITIVE — AB
Propoxyphene: NEGATIVE

## 2010-10-28 LAB — DIFFERENTIAL
Basophils Absolute: 0 10*3/uL (ref 0.0–0.1)
Lymphocytes Relative: 24 % (ref 12–46)
Neutro Abs: 5.2 10*3/uL (ref 1.7–7.7)
Neutrophils Relative %: 68 % (ref 43–77)

## 2010-10-28 LAB — OPIATE, QUANTITATIVE, URINE: Morphine, Confirm: 1600 ng/mL

## 2010-10-28 LAB — BASIC METABOLIC PANEL
BUN: 1 mg/dL — ABNORMAL LOW (ref 6–23)
Chloride: 106 mEq/L (ref 96–112)
Potassium: 4.1 mEq/L (ref 3.5–5.1)

## 2010-11-05 LAB — DIFFERENTIAL
Lymphocytes Relative: 41 % (ref 12–46)
Monocytes Absolute: 0.7 10*3/uL (ref 0.1–1.0)
Monocytes Relative: 10 % (ref 3–12)
Neutro Abs: 3.5 10*3/uL (ref 1.7–7.7)
Neutrophils Relative %: 48 % (ref 43–77)

## 2010-11-05 LAB — COMPREHENSIVE METABOLIC PANEL
Albumin: 4.1 g/dL (ref 3.5–5.2)
BUN: 6 mg/dL (ref 6–23)
Calcium: 9.8 mg/dL (ref 8.4–10.5)
Creatinine, Ser: 0.88 mg/dL (ref 0.4–1.5)
Glucose, Bld: 100 mg/dL — ABNORMAL HIGH (ref 70–99)
Total Protein: 7.5 g/dL (ref 6.0–8.3)

## 2010-11-05 LAB — CBC
HCT: 43 % (ref 39.0–52.0)
Hemoglobin: 14.9 g/dL (ref 13.0–17.0)
MCHC: 34.6 g/dL (ref 30.0–36.0)
MCV: 92.3 fL (ref 78.0–100.0)
Platelets: 209 10*3/uL (ref 150–400)
RDW: 14.5 % (ref 11.5–15.5)

## 2010-11-05 LAB — ETHANOL: Alcohol, Ethyl (B): <5 mg/dL (ref 0–10)

## 2010-12-04 NOTE — H&P (Signed)
NAME:  [name] [name], [name] [name]                ACCOUNT NO.:  [id number]   MEDICAL RECORD NO.:  [id number]          PATIENT TYPE:  INP   LOCATION:  1502                         FACILITY:  [facility]   PHYSICIAN:  Della Goo, M.D. DATE OF BIRTH:  1976/05/25   DATE OF ADMISSION:  05/20/2007  DATE OF DISCHARGE:                              HISTORY & PHYSICAL   This is an unassigned patient   CHIEF COMPLAINT:  Abdominal pain.   HISTORY OF PRESENT ILLNESS:  This is a 35 year old male who presents for  readmission after being hospitalized May 11, 2007 to May 14, 2007 for acute on chronic pancreatitis.  He returns with complaints of  severe of epigastric abdominal pain which he described as being sharp,  10/10, associated with nausea and vomiting.  He has only been able to  keep down some food and some liquids..  He denies having any  hematemesis, diarrhea or melena.  He denies having shortness of breath  or chest pain.  He also denies having fevers, chills.  The patient has a  history of alcoholism and denies drinking for the past 3 weeks secondary  to his illness.   PAST MEDICAL HISTORY:  1. History of pancreatitis.  2. Hypertension.  3. Alcoholism,   PAST SURGICAL HISTORY:  None.   MEDICATIONS:  1. Phenergan.  2. Vicodin p.r.n.   ALLERGIES:  DILAUDID which cause blurred vision and itching.   SOCIAL HISTORY:  The patient is a smoker, smokes one and half packs of  cigarettes for 15 years.  Positive alcohol history, previously drank 1  case of beer which is 24 cans daily.   FAMILY HISTORY:  Positive for hypertension in his mother.  No history of  coronary artery disease, diabetes mellitus or cancer in his family that  he knows of.   REVIEW OF SYSTEMS:  Pertinents are mentioned above.   PHYSICAL EXAMINATION:  GENERAL:  This is a thin 35 year old sick-  appearing male who is in discomfort, but no acute distress.  VITAL SIGNS:  Temperature 97.4, blood pressure 177/94, heart rate  98,  respirations 16, O2 saturations 97-99%.  HEENT:  Normocephalic, atraumatic.  Pupils equally round and reactive to  light.  There is no scleral icterus.  Funduscopic benign.  Oropharynx:  Dry oral mucosa.  No exudates, erythema.  NECK:  Supple, full range of motion.  No thyromegaly, adenopathy,  jugular venous distention.  CARDIOVASCULAR:  Regular rate and rhythm.  No murmurs, gallops or rubs.  LUNGS:  Clear to auscultation bilaterally.  ABDOMEN:  Positive bowel sounds.  Positive tenderness in the  epigastrium.  Otherwise, nontender.  No hepatosplenomegaly.  There is  positive guarding.  No rebound.  EXTREMITIES:  Without cyanosis, clubbing or edema.  NEUROLOGIC:  Alert and oriented x 3.  There are no focal deficits.   LABORATORY STUDIES:  White blood cell count 9.3, hemoglobin 13.0,  hematocrit 38.8, platelets 335, neutrophils 68%, sodium 134, potassium  3.6, chloride 102, bicarb 23, BUN 7, creatinine 0.80, glucose 119,  lipase level 384.  Alcohol level less than 5.  Calcium 9.4.  Urine  drug  screen positive for barbiturates and positive for opiates.   ASSESSMENT:  A 35 year old male being admitted with:  1. Abdominal pain.  2. Acute on chronic pancreatitis.  3. Alcoholism.  4. History of polysubstance abuse.  5. Tobacco abuse.   PLAN:  The patient will be admitted, placed on IV fluids for rehydration  and maintenance therapy.  A clear liquid diet has been ordered for now.  It will advance as tolerated.  The patient has been placed on antiemetic  therapy as needed along with pain control therapy with morphine p.r.n.  Multivitamin, thiamine, folate therapy has also been ordered.  A 3-way  abdomen has been ordered as well for abdominal imaging.  Further workup  will ensue pending the patient's x-ray results and his clinical  symptoms.  A nicotine patch has also been ordered.  DVT and GI  prophylaxis have also been ordered.      Della Goo, M.D.  Electronically  Signed     HJ/MEDQ  D:  05/20/2007  T:  05/21/2007  Job:  [id number]

## 2010-12-04 NOTE — H&P (Signed)
NAME:  [name] [name], [name] [name]                ACCOUNT NO.:  [id number]   MEDICAL RECORD NO.:  [id number]          PATIENT TYPE:  EMS   LOCATION:  MAJO                         FACILITY:  [facility]   PHYSICIAN:  Maude Leriche, MD      DATE OF BIRTH:  02-May-1976   DATE OF ADMISSION:  10/15/2007  DATE OF DISCHARGE:                              HISTORY & PHYSICAL   CHIEF COMPLAINT:  Abdominal pain.   HISTORY OF PRESENT ILLNESS:  [name] [name] is a 35 year old African  American male with history of chronic pancreatitis secondary to  alcoholism who presents with a greater than 24-hour history of mid  epigastric abdominal pain described as sharp with radiation to the back,  complicated by nausea, vomiting and tolerance of p.o.  Additionally, the  patient reports some subjective hot and cold, but no measured fevers.  The patient denies diarrhea or constipation.  The patient denies dysuria  or hematuria.   REVIEW OF SYSTEMS:  As history of present illness.  Also, a 14-point  review of systems was obtained and negative.   PAST MEDICAL HISTORY:  1. The patient does have a history of alcohol abuse and alcohol      related pancreatitis with serial hospitalizations.  2. Hypertension.  3. Alcoholism.   FAMILY HISTORY:  Both of the patient's parents are alive and well and  they are age 50s.   SOCIAL HISTORY:  The patient is unmarried.  No children.  Smokes  tobacco.  Reports alcohol abstinence at this time.  [place]   ALLERGIES:  The patient has itching when exposed to Dilaudid, but  tolerates morphine and other narcotics.   HOME MEDICATIONS:  The patient takes no prescription medications at  home.   PHYSICAL EXAMINATION:  VITAL SIGNS:  Blood pressure 153/97, temperature  97.5, pulse 95, respirations 20, saturating 100% on room air.  GENERAL:  A thin, African American male in mild distress.  HEENT:  Poor dentition.  Oropharynx clear.  Extraocular movements  intact.  CARDIOVASCULAR:  Regular rate and rhythm without murmurs, rubs or  gallops.  PULMONARY:  Clear to auscultation bilaterally without wheezes, rales or  rhonchi.  ABDOMEN:  Flat, diffusely tender to palpation but exquisitely tender in  the mid epigastric region.  The patient has active guarding when I  examined all quadrants, but does not have rebound.  EXTREMITIES:  No lower extremity edema.  No clubbing or cyanosis.  SKIN:  No rashes or wounds appreciated.   LABORATORY DATA:  Lipase is elevated at 334 which is consistent with  patient's prior pancreatitis flares.  Creatinine normal at 0.89, liver  function tests otherwise within normal limits.   ASSESSMENT/PLAN:  This is a 35 year old African American male with acute  on chronic pancreatitis who presents with acute abdominal pain, nausea,  vomiting and intolerance of oral intake consistent with an acute  pancreatitis flare.  Will admit to [facility] for  bowel rest and intravenous fluids.  We will check CT of the abdomen and  pelvis with contrast to evaluate for pancreatic necrosis  or pseudocyst  formation.  We will control pain with intravenous morphine.  We will  evaluate hypertension as the patient's pain is controlled to see if he  indeed needs additional oral antihypertensives once he is able to take  p.o.      Maude Leriche, MD  Electronically Signed     EWR/MEDQ  D:  10/15/2007  T:  10/15/2007  Job:  [id number]

## 2010-12-04 NOTE — Discharge Summary (Signed)
NAME:  [name] [name], [name] [name]                ACCOUNT NO.:  [id number]   MEDICAL RECORD NO.:  [id number]          PATIENT TYPE:  INP   LOCATION:  1504                         [facility]ACILITY:  [facility]   PHYSICIAN:  Lonia Blood, M.D.      DATE OF BIRTH:  Jan 20, 1976   DATE OF ADMISSION:  05/11/2007  DATE OF DISCHARGE:  05/14/2007                               DISCHARGE SUMMARY   PRIMARY CARE PHYSICIAN:  The patient is unassigned to [place] but goes to  [facility].   DISCHARGE DIAGNOSES:  1. Acute pancreatitis.  2. Alcoholism.  3. Tobacco abuse.   DISCHARGE MEDICATIONS:  1. Vicodin 5/500 one to two tablets q.6h. p.r.n.  2. Thiamine 100 mg daily.  3. Folate 1 mg daily.  4. Phenergan 25 mg q.6h. p.r.n.   DISPOSITION:  The patient is being discharged home.  He is not eating  and drinking.  He has been counseled extensively on smoking and drinking  alcohol.   PROCEDURES PERFORMED THIS ADMISSION:  None.   BRIEF HISTORY AND PHYSICAL:  The patient is a 35 year old gentleman with  known history of alcoholism and prior history of pancreatitis with  multiple episodes in the past.  The patient came in with complaint of  nausea, vomiting and abdominal pain.  At the time of admission he was  having continuous vomiting.  He was in the emergency room apparently on  October 18 with similar symptoms but was sent home.  This time, however,  his lipase was 322.  His alcohol level was less than 5.  His urine drug  screen was positive for cocaine and opiates.  The patient was  subsequently admitted for further management with acute-on-chronic  pancreatitis.   HOSPITAL COURSE:  #1 - ACUTE-ON-CHRONIC PANCREATITIS.  The patient was  made n.p.o.  He was hydrated on pain control as well as nausea control.  By October 22 the patient was able to start eating and drinking food.  Hence, the patient was discharged in good faith.   #2 - POLYSUBSTANCE ABUSE.  The patient was counseled extensively on his  alcohol, cocaine and  other drug use.  He was placed on thiamine and  folate at this point and is fully aware of the fact that he needs to  quit.  Otherwise, his symptoms will return.   #3 - TOBACCO ABUSE.  Again, the patient was counseled extensively and is  started on thiamine and folate.  He was started on nicotine patch.  Otherwise, his main problem is related to his drug use and as indicated,  the patient has been counseled extensively on substance abuse.  He  turned down any request for outpatient help like ADD.      Lonia Blood, M.D.  Electronically Signed     LG/MEDQ  D:  05/14/2007  T:  05/15/2007  Job:  [id number]

## 2010-12-04 NOTE — Discharge Summary (Signed)
NAME:  [name] [name], [name] [name]                ACCOUNT NO.:  [id number]   MEDICAL RECORD NO.:  [id number]          PATIENT TYPE:  INP   LOCATION:  1434                         [facility]ACILITY:  [facility]   PHYSICIAN:  Theodosia Paling, MD    DATE OF BIRTH:  1976-05-22   DATE OF ADMISSION:  04/06/2008  DATE OF DISCHARGE:                               DISCHARGE SUMMARY   PRIMARY CARE PHYSICIAN:  None at this time.   ADMISSION HISTORY:  Please copy based on the admitting note and dictated  by Dr. Darden Palmer dated April 06, 2008, under history of  presenting illness.   HOSPITAL COURSE:  The following issues were addressed during the  hospitalization:  1. Acute pancreatitis:  Patient was started on IV pain medication.      Fluids.  His diet was advanced as tolerated.  At the time of      discharge, the patient is free of abdominal pain.  He is able to      keep the foods down.  His abdominal examination is also benign.  2. Alcohol abuse:  Patient is counseled.  Ativan p.r.n. was ordered.      Patient did not have any DTs.  3. Tobacco abuse:  Patient was counseled regarding extensive tobacco      use.  A nicotine patch was received.   DISCHARGE DIAGNOSES:  1. Acute pancreatitis.  2. Alcohol abuse.  3. Tobacco abuse.   IMAGING PERFORMED:  None.   PROCEDURE PERFORMED:  None.   DISCHARGE MEDICATIONS:  Percocet 5/325 mg p.o. q.6h. p.r.n. for 5 days.   Total time spent on discharge was 35 minutes.      Theodosia Paling, MD  Electronically Signed     NP/MEDQ  D:  04/11/2008  T:  04/11/2008  Job:  [phone]

## 2010-12-04 NOTE — H&P (Signed)
NAME:  [name] [name], [name] [name]                ACCOUNT NO.:  [id number]   MEDICAL RECORD NO.:  [id number]          PATIENT TYPE:  INP   LOCATION:  1507                         [facility]ACILITY:  [facility]   PHYSICIAN:  Hettie Holstein, D.O.    DATE OF BIRTH:  1976-05-13   DATE OF ADMISSION:  08/29/2007  DATE OF DISCHARGE:                              HISTORY & PHYSICAL   PRIMARY CARE PHYSICIAN:  Unassigned.   CHIEF COMPLAINT:  I think I have pancreatitis.   HISTORY OF PRESENT ILLNESS:  [name] [name] is a very pleasant 35 year old  male who had hospitalization back in December with an episode of  pancreatitis where he underwent imaging studies and GI a consultation  with Dr. Jeani Hawking. He underwent an MRCP at that time that revealed  findings consistent with extrinsically mediated narrowing of the biliary  ducts as well as a cystic and pancreatic impression to be due to  pancreatic inflammation however, for full details the full MRCP report  must be referenced.   In any event, [name] had developed around 4:00 p.m. yesterday nausea  and vomiting that has become intractable and occurring with any sort of  oral intake including fluids or solids. He has had some abdominal pain  and left low back pain. He is on no prescription medications at home.  He denies any alcohol intake within the past 2 weeks and states that he  has been abstinent.   PAST MEDICAL HISTORY:  He does have a prior history of alcohol abuse and  alcohol related recurrent pancreatitis with several hospitalizations  noted in his medical history. He has a history of hypertension,  alcoholism.   MEDICATIONS:  He takes no prescription medications at home at this time.   ALLERGIES:  He has a reaction to Dilaudid which he develops an itch.   SOCIAL HISTORY:  He is not married, does not have children.  He  currently works with [employer]. He smokes tobacco.  He used to  drink heavily since he was age 63 but does not drink at this  time. He  states he has been staying clean.   FAMILY HISTORY:  Both of his parents are alive and well in their 18s.   REVIEW OF SYSTEMS:  His weight has been stable.  He has not had  difficulty with appetite and with the exception of the past 24 hours.  Otherwise his review of systems is unremarkable.   PHYSICAL EXAMINATION:  Blood pressure in the emergency department  reveals blood pressure being 153/103, heart rate 100, temperature 98.2,  O2 saturation 97%.  HEENT:  Reveals head to be normocephalic, atraumatic.  Extraocular  muscles intact.  NECK:  Supple, nontender. No palpable thyromegaly or mass. His dentition  is poor.  He does have halitosis and most probably multiple dental  caries.  CARDIOVASCULAR:  Normal S1 and S2.  LUNGS:  Clear.  There is normal effort.  No dullness to percussion.  ABDOMEN:  Soft, however, it is very tender in the mid epigastric region.  He has no costovertebral angle or suprapubic tenderness.  LOWER EXTREMITIES:  Reveal no edema.  NEUROLOGIC:  Exam reveals no focal deficits.   His lipase is 624.  Sodium is 137, potassium is 3.5, BUN 6, creatinine  0.77, glucose 124. AST/ALT 43/40, albumin of  4.1, calcium 9.8, WBC  10.1, hemoglobin 15, platelet count 196, MCV of 93.   ASSESSMENT:  1. Acute on chronic pancreatitis. He has currently been abstinent of      alcohol.  2. Hypertension.  3. Tobacco abuse.   PLAN:  [name] will be admitted and we will check a CT scan to rule  out pancreatic necrosis or pseudocyst formation. Will administer IV  fluids and keep him n.p.o. on bowel rest and follow his clinical course  accordingly.      Hettie Holstein, D.O.  Electronically Signed     ESS/MEDQ  D:  08/29/2007  T:  08/31/2007  Job:  [id number]

## 2010-12-04 NOTE — Discharge Summary (Signed)
NAME:  [name] [name], [name] [name]                ACCOUNT NO.:  [id number]   MEDICAL RECORD NO.:  [id number]          PATIENT TYPE:  INP   LOCATION:  5127                         FACILITY:  [facility]   PHYSICIAN:  Marcellus Scott, MD     DATE OF BIRTH:  Feb 15, 1976   DATE OF ADMISSION:  10/15/2007  DATE OF DISCHARGE:  10/18/2007                               DISCHARGE SUMMARY   PRIMARY MEDICAL DOCTOR:  [facility].   DISCHARGE DIAGNOSES:  1. Acute on chronic pancreatitis.  2. Alcoholism.  3. Leukocytosis.  4. Hypertension.  5. Tobacco abuse.  6. Medication noncompliance.   DISCHARGE MEDICATIONS:  1. Vicodin 500/5 one p.o. q.6 h. p.r.n., 15 tablets dispensed.  2. Multivitamin one p.o. daily.   PROCEDURES:  1. CT of the abdomen with contrast, impression:  A.  Chronic      pancreatitis with calcifications likely related to prior episodes      of pancreatitis.  No vascular complications.  B.  Fatty liver.  2. CT of the pelvis with contrast, impression:  A.  Tiny amount of      free fluid secondary to pancreatitis.   PERTINENT LABORATORY:  Lipase on admission was 334 and lipase done on  March 28th was down to 65.  Basic metabolic panel unremarkable with BUN  of 3, creatinine 0.72.  CBC:  Hemoglobin 12.3, hematocrit 35.7, white  blood cells 7, platelets 184.  Urinalysis not in keeping with a UTI and  no symptoms.  Hepatic panel was unremarkable.   CONSULTATIONS:  None.   HOSPITAL COURSE:  Please refer to the history and physical note for  initial admission details.  In summary, [name] [name] is a 35 year old  African American male patient with history of chronic pancreatitis  secondary to alcoholism who has had multiple admissions for acute flares  who presented with history of mid epigastric abdominal pain with nausea,  vomiting, and intolerance to oral intake.  To further evaluate and rule  out complications of pancreatitis such as pseudocyst or necrosis, a  repeat CT of the abdomen was done which  confirmed chronic pancreatitis  with no other complications.  The patient was thereby admitted to the  medical floor.  He was initially made NPO and placed on IV fluids and  pain medications.  Within the first 12 hours, the patient made  significant improvement in his pain following which he was initially  started on a clear liquid diet which he tolerated and was advanced to a  low fat diet.  He had some worsening of pain after meals which is now  much improved.  The patient indicates that he can manage this amount of  pain at home on oral medications.  He has tolerated oral feeds and has  had BMs.  The patient continues to consume an undetermined amount of  alcohol.  He has been counseled multiple times over this admission  regarding abstinence from alcohol.  He has been counseled that further  intake of alcohol could only worsen his pancreatitis and will lead to  other complications.  The patient verbalizes understanding and indicates  he will quit but unsure how much he will abide by the advice.  The patient has been noncompliant with his antihypertensive medications.  His blood pressures were basically normal but are slowly creeping up in  the 150s/70s.  We will not start any medications for this until he is  followed up at his PMD's office and stays compliant.   PATIENT DISPOSITION:  The patient at this time is stable with stable  vital signs, soft abdomen.  He will be discharged and asked to follow up  with his primary medical doctor as an outpatient.      Marcellus Scott, MD  Electronically Signed     AH/MEDQ  D:  10/18/2007  T:  10/18/2007  Job:  [id number]

## 2010-12-04 NOTE — Consult Note (Signed)
NAME:  [name], [name]                ACCOUNT NO.:  [id number]   MEDICAL RECORD NO.:  [id number]          PATIENT TYPE:  INP   LOCATION:  1502                         FACILITY:  [facility]   PHYSICIAN:  Jordan Hawks. Elnoria Howard, MD    DATE OF BIRTH:  September 27, 1975   DATE OF CONSULTATION:  05/22/2007  DATE OF DISCHARGE:                                 CONSULTATION   REASON FOR CONSULTATION:  Chronic pancreatitis and abnormal liver  enzymes as well as imaging.   REFERRING PHYSICIAN:  [facility] hospitalist.   HISTORY OF PRESENT ILLNESS:  This is a 35 year old gentleman with a past  medical history of pancreatitis secondary to alcoholism and hypertension  who was admitted to the hospital with complaints of epigastric pain.  The patient was previously hospitalized from October 20 to October 23  for an acute exacerbation of his chronic pancreatitis.  He subsequently  improved with conservative management and was discharged home.  Unfortunately his pain returned.  He has not drank within the last 3  weeks but previously he has had a long history of drinking approximately  a case per day.  The patient does report having some nausea and vomiting  but no complaints of any fevers, chills, diarrhea, constipation,  hematemesis or hematochezia.  A CT scan of the abdomen was performed and  he was noted to have an acute change in his biliary tract from his May  CT scan.  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  GI was requested  for further evaluation and treatment.   PAST MEDICAL HISTORY:  As above.   PAST SURGICAL HISTORY:  As above.   FAMILY HISTORY:  Noncontributory.   SOCIAL HISTORY:  Is positive for alcohol, positive for tobacco.   ALLERGIES:  DILAUDID.   MEDICATIONS:  At home were Phenergan and Vicodin.   REVIEW OF SYSTEMS:  See above in history of present illness.  Otherwise  negative.   PHYSICAL EXAMINATION:  VITAL SIGNS:  Blood pressure is 177/107, heart  rate is 77, temperature is 97.4,  respirations 18.  GENERAL:  The patient is in no acute distress.  Alert and oriented.  HEENT:  Normocephalic, atraumatic.  Extraocular muscles intact.  NECK:  Supple.  No lymphadenopathy.  LUNGS:  Clear to auscultation bilaterally.  CARDIOVASCULAR:  Regular rate and rhythm.  ABDOMEN:  Flat, soft, tender in the epigastrium.  No rebound or  rigidity.  Positive bowel sounds.  EXTREMITIES:  No clubbing, cyanosis or edema.   LABORATORY VALUES:  White blood cell count is 6.9, hemoglobin 11.9, MCV  is 95.5, platelets at 329, sodium 136, potassium 4.0, chloride 108, CO2  23, glucose 88, BUN 2, creatinine is 0.7.  Total bilirubin is 1.3 which  is a decline from 3.0.  Alkaline phosphatase from 134.  AST of 307, ALT  of 421.  Albumin is 2.0.   IMPRESSION:  1. Chronic pancreatitis.  2. Double duct sign.  3. Alcoholism.   After close review of the patient's CT scan he does demonstrate double  duct sign but there is no evidence of any ampullary mass  or a stone,  ultrasound of his gallbladder is negative.  However, he is noted to have  a thickened gallbladder.  This can be secondary to the inflammatory  changes associated with the inflammation of the pancreas.  At this time  I believe it would be prudent to evaluate the patient with an MRCP  rather than pursuing an ERCP at this time.  This can possibly help to  isolate any distal obstruction versus just edema of the distal CBD  secondary to the pancreatitis.  Additionally, chronic pancreatitis can  result in stricturing of the distal CBD which can present as a  significant problem for the patient.   PLAN:  1. Continue with pain management.  2. To perform MRCP (magnetic resonance cholangiopancreatography).  3. To follow his transaminases.      Jordan Hawks Elnoria Howard, MD  Electronically Signed     PDH/MEDQ  D:  05/22/2007  T:  05/24/2007  Job:  [id number]

## 2010-12-04 NOTE — H&P (Signed)
NAME:  [name] [name], [name] [name]                ACCOUNT NO.:  [id number]   MEDICAL RECORD NO.:  [id number]         PATIENT TYPE:  LINP   LOCATION:                               FACILITY[facility]   PHYSICIAN:  Eduard Clos, MDDATE OF BIRTH:  August 03, 1975   DATE OF ADMISSION:  02/25/2008  DATE OF DISCHARGE:                              HISTORY & PHYSICAL   CHIEF COMPLAINT:  Abdominal pain.   HISTORY OF PRESENT ILLNESS:  A 35 year old male with a history of  chronic alcoholic pancreatitis presented with complaint of abdominal  pain.  The patient said the pain started off yesterday morning in the  epigastric area and radiating to the back, associated with some nausea.  Denies any vomiting or diarrhea.  The patient stated the pain is intense  and is similar to the one he had last month when he was admitted the  hospital for pancreatitis.  The patient had high lipase levels, and the  patient was admitted for further management of his pain due to  pancreatitis.  The patient denies any fever or chills.  Denies any chest  pain, shortness of breath, any weakness of limbs, loss of consciousness,  any dysuria, discharge or diarrhea.   PAST MEDICAL HISTORY:  Recurrent episodes of pancreatitis due to  alcoholism.   PAST SURGICAL HISTORY:  None.   MEDICATIONS ON ADMISSION:  None.   FAMILY HISTORY:  Nothing contributory.   SOCIAL HISTORY:  The patient smokes cigarettes.  Drinks alcohol; the  last drink per patient was 3 days ago.  Has history of drug abuse.  The  patient has been advised to quit smoking cigarettes, drinking alcohol or  abusing drugs.   REVIEW OF SYSTEMS:  As per history of present illness.  Nothing else  significant.   PHYSICAL EXAMINATION:  The patient examined at bedside, not in distress.  VITAL SIGNS:  Blood pressure is 125/85, pulse 90 per minute, temperature  97.8, respirations 20 per minute, oxygen saturation 100%.  HEENT:  Anicteric.  No pallor.  CHEST:  Bilateral air  entry present.  No rhonchi.  No crepitation.  HEART:  S1-S2 heard.  ABDOMEN:  There is mild tenderness in the epigastric area.  No guarding  or rigidity.  Bowel sounds heard.  No discoloration.  CENTRAL NERVOUS SYSTEM:  The patient alert, awake and oriented to time,  place and person.  Moves upper and lower extremities 5/5.  Peripheral  pulses felt.  No edema.   LABORATORY DATA:  Acute abdominal series negative.  CBC:  WBC 14.4,  hemoglobin 14.6, hematocrit 43.1, platelets 276, neutrophils 79%.  Basic  metabolic panel:  Sodium 139, potassium 3.8, chloride 106, carbon  dioxide 25, glucose 121, BUN 7, creatinine 0.81.  Total bilirubin 0.9,  alkaline phosphatase 47, AST 35, ALT 44, albumin 3.9, calcium 9.6,  lipase 414.  UA negative for nitrites but small leukocytes, WBC 11-20.   ASSESSMENT:  1. Acute and chronic alcoholic pancreatitis.  2. Possible urinary tract infection.  3. History of polysubstance abuse.   PLAN:  Admit the patient to telemetry.  Will place the patient  on IV  fluids n.p.o. until the patient's pain is relieved.  Get a CT of the  abdomen and pelvis.  Urine cultures.  Place on empiric antibiotics.  Further recommendations as the patient's condition evolves.  The patient  has been advised to quit smoking cigarettes, drinking alcohol or using  drugs.      Eduard Clos, MD  Electronically Signed     ANK/MEDQ  D:  02/25/2008  T:  02/25/2008  Job:  [id number]

## 2010-12-04 NOTE — H&P (Signed)
NAME:  [name], [name]                ACCOUNT NO.:  [id number]   MEDICAL RECORD NO.:  [id number]          PATIENT TYPE:  INP   LOCATION:  1336                         FACILITY:  [facility]   PHYSICIAN:  Vania Rea, M.D. DATE OF BIRTH:  03/09/1976   DATE OF ADMISSION:  02/08/2008  DATE OF DISCHARGE:                              HISTORY & PHYSICAL   PRIMARY CARE PHYSICIAN:  Unassigned.   CHIEF COMPLAINT:  Nausea, vomiting, abdominal pain since yesterday.   HISTORY OF PRESENT ILLNESS:  This is a 35 year old African American  gentleman with a history of chronic pancreatitis and recurrent  admissions for acute pancreatitis, in fact, this was his seventh  admission; he continues to drink about 1 case of beer per day.  He says  he has not been increasing his alcohol consumption, however yesterday  started having nausea, vomiting and abdominal pain which he recognized  as an attack of pancreatitis.  When it did not resolve, he came to the  emergency room to seek treatment.  He has been having no fever, no  chills, no dizziness or syncope.  There is no blood in the vomitus.   PAST MEDICAL HISTORY:  1. Recurrent pancreatitis.  2. Hypertension.   MEDICATIONS:  None.   ALLERGIES:  DILAUDID causes itching.   SOCIAL HISTORY:  Smokes one pack per day of tobacco.  Drinks a case of  beer per day.  Occasional cocaine use.   FAMILY HISTORY:  Significant for hypertension.  There is no family  history of addictions.   REVIEW OF SYSTEMS:  Other than noted above, 10-point review of systems  is negative.   PHYSICAL EXAMINATION:  Pleasant young African American gentleman lying  in bed.  He has received morphine and he is in no distress at this time.  VITAL SIGNS:  Temperature is 97.7, pulse 93, respiration 18, blood  pressure 151/103.  Saturating 97% on room air.  Pupils are round and equal.  Mucous membranes are pink and anicteric.  He is mildly dehydrated.  He has some carious teeth.  No  cervical lymphadenopathy, thyromegaly or jugular venous distention.  CHEST:  Clear to auscultation bilaterally.  CARDIOVASCULAR:  Regular rhythm without murmur.  ABDOMEN:  Scaphoid, firm, and he has diffuse tenderness.  He has  decreased bowel sounds.  EXTREMITIES:  Without edema.  He has 3+ bounding pulses bilaterally.  He has no bone or joint deformities.  CENTRAL NERVOUS SYSTEM:  Cranial nerves II-XII are grossly intact.  He  has no focal neurologic deficits.   LABORATORY DATA:  His lipase is 954.  His CBC is unremarkable.  He has  no leukocytosis.  His serum chemistry is likewise unremarkable.  His  liver function tests:  Bilirubin is normal,  alkaline phosphatase is  normal, AST is elevated to 59, ALT is elevated to 104, total protein 7.4  and albumin 3.8.  The patient does have a history of fatty liver.   ASSESSMENT:  1. Acute on chronic pancreatitis.  2. Hypertension, uncontrolled.   PLAN:  We will admit this gentleman for IV fluid hydration, pain  management, and  will start intravenous antibiotics p.r.n. for blood  pressure control.  We will also recommend substance abuse counseling  since he does indicate the desire to stop drinking.      Vania Rea, M.D.  Electronically Signed     LC/MEDQ  D:  02/09/2008  T:  02/09/2008  Job:  [id number]

## 2010-12-04 NOTE — H&P (Signed)
NAME:  [name] [name], [name] [name]                ACCOUNT NO.:  [id number]   MEDICAL RECORD NO.:  [id number]          PATIENT TYPE:  EMS   LOCATION:  ED                           FACILITY[facility]  [facility]   PHYSICIAN:  Michiel Cowboy, MDDATE OF BIRTH:  Dec 11, 1975   DATE OF ADMISSION:  02/01/2009  DATE OF DISCHARGE:                              HISTORY & PHYSICAL   PRIMARY CARE Boubacar Lerette:  None.   CHIEF COMPLAINT:  Abdominal pain.   The patient is a 35 year old gentleman with history of recurrent  pancreatitis and alcohol abuse as well as recurrent tobacco abuse.  The  patient since today morning started developing nausea and vomiting,  exacerbated by eating.  Typical of his pancreatitis pain.  He presented  to the emergency department, where his lipase was elevated at 110.  A  KUB showed chronic pancreatitis calcifications.  He is being admitted  for pain management and pancreatitis treatment.   Otherwise review of systems, no fevers, no chills.  He had experienced  some loose stools, a poor appetite since yesterday.  His last alcoholic  drink was 2 days ago.  He continues to drink about 12-pack per day.  States that he has not had any history of withdrawal or seizures in the  past.   No chest pain, no shortness of breath.  No fevers, no chills.  Otherwise  review of systems is as per HPI.   PAST MEDICAL HISTORY:  1. Recurrent pancreatitis secondary to continuous alcohol abuse.  2. Alcohol abuse.  3. Tobacco abuse.   SOCIAL HISTORY:  The patient drinks 12 pack per day.  Smokes about a  pack a day.   FAMILY HISTORY:  Noncontributory.   ALLERGIES:  None.   MEDICATIONS:  None.   PHYSICAL EXAMINATION:  VITALS:  Temperature 97.8, blood pressure 160/94,  pulse 100, respirations 20, satting at 99% on room air.  The patient appears to be currently in no acute distress.  HEAD:  Nontraumatic.  Moist mucous membranes.  LUNGS:  Clear to auscultation bilaterally.  HEART:  Regular rate and rhythm.   No murmurs, rubs or gallops.  ABDOMEN:  Epigastric tenderness noted but no peritoneal signs.  LOWER EXTREMITIES:  Without clubbing, cyanosis or edema.  NEUROLOGIC: The patient appears to be grossly intact.  No tremor noted.  No asterixis.  SKIN:  Clean and dry.   LABS:  White blood cell count 12.7, hemoglobin 14.6.  Sodium 136,  potassium 3.6, creatinine 1.  LFTs within normal limits.  Lipase 110.   KUB showing chronic pancreatic calcifications.   ASSESSMENT/PLAN:  This is a 35 year old gentleman with recurrent  pancreatitis, likely secondary to alcohol.  I have counseled him heavily  stopping alcohol abuse.  He says that he is somewhat interested.  Will  admit for pain control and pancreatitis.  1. Pancreatitis.  Will make n.p.o. give IV fluids.  Get a right upper      quadrant ultrasound to evaluate pancreas as well as liver to see if      there are any early stages cirrhosis.  2. Alcohol abuse.  The patient has  no history of seizure or      withdrawal.  It has 2 days since alcohol.  Check EtOH level.  Will      check urine toxicity screen since he is at risk for polysubstance      abuse.  Will give Ativan p.r.n. if any signs of the beginning of      withdrawals started on protocol.  Will also give thiamine IV.Marland Kitchen  3. Prophylaxis:  Protonix plus SCDs.      Michiel Cowboy, MD  Electronically Signed     AVD/MEDQ  D:  02/01/2009  T:  02/02/2009  Job:  [id number]

## 2010-12-04 NOTE — Discharge Summary (Signed)
NAME:  [name] [name], [name] [name]                ACCOUNT NO.:  [id number]   MEDICAL RECORD NO.:  [id number]          PATIENT TYPE:  INP   LOCATION:  1505                         FACILITY:  [facility]   PHYSICIAN:  Marcellus Scott, MD     DATE OF BIRTH:  [birth year]   DATE OF ADMISSION:  02/01/2009  DATE OF DISCHARGE:  02/03/2009                               DISCHARGE SUMMARY   PRIMARY MEDICAL DOCTOR:  Gentry Fitz.   DISCHARGE DIAGNOSES:  1. Acute on chronic alcoholic pancreatitis.  2. Alcohol dependence.  3. Tobacco abuse.  4. Substance abuse-cocaine.   DISCHARGE MEDICATIONS:  1. Thiamine 100 mg p.o. daily.  2. Multivitamins 1 p.o. daily.  3. Tylenol 650 mg p.o. q.4 h. p.r.n.  The patient has been advised to      take this briefly for a couple of days and then discontinue.   PROCEDURES:  1. Ultrasound of the abdomen impression:  Changes of chronic      pancreatitis with calcifications and dilated pancreatic duct.      Gallbladder wall thickness minimally prominent at 3.1 mm.  No      gallstones or cholecystic fluid detected.  2. X-ray of the abdomen impression:  No acute findings.  Chronic      pancreatic calcifications.   LABORATORY DATA:  Basic metabolic panel this morning unremarkable with  BUN 1, creatinine 0.87.  Urine drug screen positive for cocaine,  metabolites, opiates.  Lipase 52.  Hepatic panel yesterday unremarkable  except for an albumin of 3.2.  INR 1.  CBC within normal limits with  hemoglobin 13.1, hematocrit 38.7, white blood cell 8.4, platelets 197,  blood alcohol level on admission less than 5, lipase on admission 110.  Hepatic panel on admission was within normal limits.   CONSULTATIONS:  None.   HOSPITAL COURSE/PATIENT DISPOSITION:  [name] is a 35 year old  African American male patient with history of chronic pancreatitis,  ongoing alcohol, tobacco and substance abuse, who has had repeated  hospital admissions for his pancreatitis.  This time, he presented again  with history of nausea, vomiting and abdominal pain.  He had some loose  stools.  He continues to drink  12 beers daily.  He smokes a pack a day  and abuses cocaine.  In the emergency room, his vitals signs were  stable.  He was admitted for further evaluation and management.   PROBLEMS:  1. Acute on chronic alcoholic pancreatitis.  The patient was admitted      to the hospital.  His bowel was rested.  He was placed on IV fluids      and pain medications.  Ultrasound of the abdomen was unremarkable.      Yesterday, he was started on clears which he tolerated.  This      morning, his diet was advanced which he has tolerated.  He      indicates he has minimal intermittent pain, but no nausea,      vomiting.  No further diarrhea.  The patient is ambulating in the      hallway.  His abdomen is benign to  exam.  The patient will be      discharged home.  2. Alcohol abuse.  Repeatedly counseled regarding cessation.  Unsure      how much of this the patient will heed because this has been done      repeatedly in the previous admissions.  He will be discharged on      vitamin supplements.  He was not placed on an Ativan withdrawal      protocol.  He has not demonstrated any withdrawal signs or      symptoms.  3. Tobacco abuse.  Again, cessation counseling done.  He will not be      discharged on a nicotine patch.  4. Substance abuse/cocaine.  Again, cessation counseling done.   DISPOSITION:  The patient at this time is stable for discharge home.  He  indicates he does not have a primary MD.  We will try to get him an  appointment to be seen at [facility].   Time taken in coordinating this discharge is less than 30 minutes.      Marcellus Scott, MD  Electronically Signed     AH/MEDQ  D:  02/03/2009  T:  02/03/2009  Job:  [id number]   cc:   Melvern Banker  Fax: [phone]

## 2010-12-04 NOTE — H&P (Signed)
NAME:  [name] [name], [name] [name]                ACCOUNT NO.:  [id number]   MEDICAL RECORD NO.:  [id number]          PATIENT TYPE:  INP   LOCATION:  1504                         FACILITY:  [facility]   PHYSICIAN:  Beckey Rutter, MD  DATE OF BIRTH:  [birth year]   DATE OF ADMISSION:  05/11/2007  DATE OF DISCHARGE:                              HISTORY & PHYSICAL   PRIMARY CARE PHYSICIAN:  He is unassigned to [facility].   CHIEF COMPLAINT:  Abdominal pain.   HISTORY OF PRESENT ILLNESS:  This is 35 year old African-American male  who presented with abdominal pain.  He stated the pain started about a  week ago.  At that time, he was drinking heavily, about 12 beers per  day, and the pain was getting progressively worse.  The pain is  associated with nausea and vomiting and unable to tolerate food for the  last 3 days.  This morning the pain woke him up from sleep. The pain is  mainly in the epigastric area with radiation to the left side of his  abdomen and to his back.  The pain intensity was 10/10 on presentation,  now improved after narcotic pain medicine given to him in the emergency  room.  He denied fever, shortness of breath, headache or chills, but he  was concerned about the severe nausea and vomiting he is encountering.   PAST MEDICAL HISTORY:  Tobacco abuse and some substance abuse.  The  patient is not following up with any physician, and he has not seen a  physician before.   SOCIAL HISTORY:  The patient is a current smoker, one pack per day for  the last 15 years or so. He is a heavy drinker, mainly drinks beer,  about a 12-pack of beer per day. He also admitted to using cocaine, and  he used cocaine within the last week or so.  He denied marijuana use.   FAMILY HISTORY:  The denies significant family history of heart disease,  diabetes or similar condition.   MEDICATIONS:  He was not taking medication prior to hospitalization.   MEDICATION ALLERGY:  DILAUDID is listed as allergy  causing blurred  vision and itching.   REVIEW OF SYSTEMS:  As per HPI.   PHYSICAL EXAMINATION:  GENERAL:  He is alert, oriented x3, sitting on  the bed.  VITAL SIGNS:  His temperature is 97.7, blood pressure 144.98, pulse 82,  respiratory rate  20.  HEENT:  Head:  Atraumatic, normocephalic.  Eyes:  PERRLA.  Mouth moist.  Poor mouth hygiene.  NECK:  Supple.  No JVD.  LUNGS:  Bilateral fair air entry.  HEART:  Precordium first second heart sounds audible.  No added sound.  ABDOMEN:  Bowel sounds present.  Soft superficial tenderness in the  epigastric area.  EXTREMITIES:  No lower extremity edema.  NEUROLOGIC:  He is alert, oriented x3, giving history.  Moving all his  extremities spontaneously.   LABORATORY AND X-RAY DATA:  PT is 13.3, INR 1.0. Lipase is 322. Ethanol  level is less than 5.  Sodium is 135, potassium 3.6, chloride 102,  bicarb 25, glucose 103, BUN 5,  and creatinine 0.83. White blood count  is 10.4, hemoglobin 13.7, hematocrit 40.5, and platelet count is 203.  Lipase level done previously on October 18 was 167, and today, as  mentioned above, was 322.   CT abdomen done on Nov 24, 2006, was showing acute pancreatitis with  prepancreatic edema and ascites.  No pseudocyst or pancreatic necrosis  is identified.   EKG is pending.   ASSESSMENT/PLAN:  This is 35 year old African-American male who  presented today with acute pancreatitis. He has a history of previous  pancreatitis in May, and it seems like acute on chronic picture.   PLAN:  1. The patient be admitted for further assessment and management.  2. The patient will be started on IV hydration, and he will kept      n.p.o. to rest his bowel and the pancreas. We would try to start      clear fluids as soon as he is no longer nauseated.  3. The patient will be started on antiemetic medication for nausea,      and, depending on the course of the hospitalization, further      management will be initiated.  4.  The patient has substance abuse problem as well as ethanol abuse.      He probably needs social work evaluation and rehab to abstain from      the ethanol abuse since it is a major reason that can be found at      this point of care as cause of his pancreatitis and recurrent      pancreatitis.  5. For DVT prophylaxis, I will start the patient on sequential      pneumatic device.  6. for his GI prophylaxis, I will start Protonix IV.  7. I will give the patient nicotine patch during this hospitalization.      Will also provide smoking cessation counseling and smoke cessation      package to help him to quit smoking as well.      Beckey Rutter, MD  Electronically Signed     EME/MEDQ  D:  05/11/2007  T:  05/11/2007  Job:  [id number]

## 2010-12-04 NOTE — Discharge Summary (Signed)
NAME:  [name] [name], [name] [name]                ACCOUNT NO.:  [id number]   MEDICAL RECORD NO.:  [id number]          PATIENT TYPE:  INP   LOCATION:  1336                         FACILITY:  [facility]   PHYSICIAN:  Lonia Blood, M.D.DATE OF BIRTH:  22-Jul-1976   DATE OF ADMISSION:  02/08/2008  DATE OF DISCHARGE:  02/14/2008                               DISCHARGE SUMMARY   PRIMARY CARE PHYSICIAN:  Unassigned.   DISCHARGE DIAGNOSES:  1. Recurrent acute alcohol-related pancreatitis, resolved.  2. Alcohol abuse.      a.     Counseled extensively as to the need to discontinue alcohol       abuse.      b.     Referred for outpatient counseling Tuesday, July 28, at the       [facility].      c.     Completed Ativan withdrawal protocol.  3. Cocaine abuse, counseled as to need for discontinuation with      outpatient referral to counseling center.  4. Elevated blood pressure, no antihypertensive started in the acute      setting.   DISCHARGE MEDICATIONS:  1. Ranitidine 300 mg p.o. daily for 10 days, then stop.  2. Morphine sulfate 15 mg one to two p.o. q.4 h. p.r.n. pain      associated with pancreatitis, #40 given, no refills.   FOLLOW-UP:  1. The patient is to follow up for outpatient substance abuse      counseling at the [facility] at 3 p.m. on Tuesday, July 28.  2. The patient is to call [phone] to schedule referral to a new      primary care physician for ongoing outpatient follow-up.   CONSULTATIONS:  None.   PROCEDURES:  None.   HOSPITAL COURSE:  Mr. [name] [name] is a 35 year old gentleman with no  significant medical history except for pancreatitis who presented to the  hospital on February 08, 2008, with complaints of nausea, vomiting and  abdominal pain for the last 24 hours.  He admitted that he continued to  drink in excess of one case of beer per day.  The patient was admitted  to the acute units.  Physical exam and laboratory suggested pancreatitis  and this was confirmed with  a lipase of 954.  The patient was treated  with strict n.p.o. status.  IV pain medications were administered.  IV  fluid was given for volume resuscitation.  As the patient's pain  improved, he was titrated to a clear liquid diet and pain medication was  decreased.  As the patient continued to improve, he was able to be  advanced to a regular diet, which he tolerated without difficulty.   The patient was counseled extensively as to the direct connection  between his alcohol abuse and his pancreatitis.  He was also advised of  the multiple deleterious effects of ongoing tobacco abuse and the  significant decrease in life expectancy if he continued to drink.  The  patient had also tested positive for cocaine and was counseled  extensively as to the multiple deleterious effects of ongoing  cocaine  abuse.  Social work met with the patient and counseled him as well.  He  was [facility] on Tuesday, July 28 at 3  o'clock p.m. for ongoing outpatient counseling.  An Ativan withdrawal  protocol had been administered during the inpatient stay and the patient  tolerated this without difficulty.  By February 14, 2008, the patient was  tolerating a regular diet without difficulty.  There was no evidence of  active alcohol withdrawal.  The patient had been volume-resuscitated.  Abdominal pain was to a minimum.  The patient's Ativan withdrawal  protocol had been essentially completed.  The patient was cleared for  discharge with recommendation to follow a strict low-fat diet until his  abdominal pain is completely resolved and to avoid all alcohol or  illicit substances.      Lonia Blood, M.D.  Electronically Signed     JTM/MEDQ  D:  02/14/2008  T:  02/14/2008  Job:  [id number]

## 2010-12-04 NOTE — H&P (Signed)
NAME:  Verbrugge, Karlos                ACCOUNT NO.:  [id number]   MEDICAL RECORD NO.:  [id number]          PATIENT TYPE:  INP   LOCATION:  1434                         FACILITY:  [facility]   PHYSICIAN:  Beckey Rutter, MD  DATE OF BIRTH:  1975-10-08   DATE OF ADMISSION:  04/06/2008  DATE OF DISCHARGE:                              HISTORY & PHYSICAL   PRIMARY CARE PHYSICIAN:  Unassigned to [facility].   CHIEF COMPLAINT:  Abdominal pain.   HISTORY OF PRESENT ILLNESS:  This is a 35 year old African American male  with past medical history significant for chronic alcoholic  pancreatitis, tobacco abuse, ethanol abuse and history of polysubstance  abuse, who presented today with abdominal pain.  The patient described  the pain as tense, similar to the pain he experienced on previous  admission for acute pancreatitis.  The patient stated that he did not  drink any alcohol yesterday, but he admitted to drinking a little bit  the day before which is 6 drinks.  The patient is known to have history  of ethanol abuse with chronic alcoholic pancreatitis.  Today, his lipase  is 823 U/L.  His alcohol level is less than 5.   PAST MEDICAL HISTORY:  1. Chronic alcoholic pancreatitis with frequent acute exacerbation      after drinking alcohol.  2. Multiple hospital admissions for episodes of pancreatitis.  3. Tobacco abuse.  4. Ethanol abuse.  5. History of polysubstance abuse.   SOCIAL HISTORY:  The patient lives with his family in the [place]  area.  He is a smoker and he has history of ethanol abuse as above.   FAMILY HISTORY:  Family history is significant for hypertension.   MEDICATION ALLERGIES:  HE IS ALLERGIC TO DILAUDID, GIVING HIM ITCHING.   MEDICATIONS:  None.   REVIEW OF SYSTEMS:  A 12 point review of systems noncontributory.   PHYSICAL EXAMINATION:  VITAL SIGNS:  Temperature 98.0, blood pressure  171/98, pulse 77, respiratory rate is 20  HEENT:  Head atraumatic, normocephalic.   Eyes; PERRL.  Mouth moist, no  ulcer.  NECK:  Supple.  No JVD.  LUNGS:  Bilateral fair air entry.  No adventitious sounds.  Precordium  examination, first and second heart sounds audible.  No added sounds.  ABDOMEN:  There is mild tenderness in the epigastric and right upper  quadrant area.  There is no guarding and no significant rigidity.  Bowel  sounds heard.  No significant abdominal peristalsis or skin  discoloration.  CENTRAL NERVOUS SYSTEM:  Alert and oriented x3, moving all his  extremities spontaneously.   DATA/LABORATORY RESULTS:  Showing lipase of 823.  Alcohol is less than  5.  Sodium 135, potassium 4.0, chloride 104, bicarb 26, glucose 113, BUN  9, creatinine 0.74.  White blood count is 8.4, hemoglobin is 14.1,  hematocrit is 41.6 and platelet count is 175.  The patient does not have  a CT scan at this time.   ASSESSMENT AND PLAN:  This is 35 year old African American male with  medical noncompliance who presented frequent times with acute flare of  pancreatitis  secondary to alcohol binging.  Today, the patient has  evidence of pancreatitis clinically by elevated lipase, and I do not  feel the need to order CAT scan since this condition is recurrent and  basically the pancreatitis picture happens every time he go on a  drinking binge.   PLAN:  1. The patient will be admitted for further assessment and management.  2. Will admit the patient to telemetry floor.  3. Will keep the patient n.p.o. until abdominal pain has improved.  4. Will achieve pain control with intravenous morphine on a p.r.n.      basis.  5. Will hydrate the patient with intravenous fluids, normal saline.  6. For tobacco abuse, the patient is counseled to quit smoking.  Will      start the patient on a nicotine patch while in the hospital.  7. Substance abuse and medical noncompliance.  Will consider clinical      social work evaluation to help with detox and maybe placement for       detoxification.  8. For GI prophylaxis, I will start Protonix intravenously.  9. For DVT prophylaxis will consider sequential pneumatic device.      Beckey Rutter, MD  Electronically Signed     EME/MEDQ  D:  04/06/2008  T:  04/08/2008  Job:  [id number]

## 2010-12-04 NOTE — H&P (Signed)
NAME:  [name] [name], [name] [name]                ACCOUNT NO.:  [id number]   MEDICAL RECORD NO.:  [id number]          PATIENT TYPE:  INP   LOCATION:  1827                         FACILITY:  [facility]   PHYSICIAN:  Vania Rea, M.D. DATE OF BIRTH:  15-Aug-1975   DATE OF ADMISSION:  04/28/2008  DATE OF DISCHARGE:                              HISTORY & PHYSICAL   PRIMARY CARE PHYSICIAN:  Unassigned.   CHIEF COMPLAINT:  Acute pancreatitis.   HISTORY OF PRESENT ILLNESS:  This is a 35 year old African American  gentleman with a known history of tobacco and alcohol abuse and  recurrent admissions for acute pancreatitis.  The patient has been  counseled many times about the importance of tobacco and alcohol  cessation in relation to his recurrent pancreatitis, all to no avail.  The patient was discharged from this facility on April 11, 2008,  after treatment for acute pancreatitis.  He has once again returned to  excessive drinking, last had a drink three days ago and developed severe  abdominal pain and vomiting.  He returns to the emergency room today  with severe abdominal pain.  The hospitalist service was called to  assist in management.   PAST MEDICAL HISTORY:  1. Recurrent pancreatitis.  2. Tobacco abuse.  3. Alcohol abuse.  4. Noncompliance with medical advice.   MEDICATIONS:  None.   ALLERGIES:  DILAUDID, AND HE NOW SAYS MORPHINE IS CAUSING HIM TO BE  ITCHING.   SOCIAL HISTORY:  Smokes 2 packs per day.  Drinks large amounts of  alcohol per day.  No history of illicit drug use.   REVIEW OF SYSTEMS:  Other than noted above, 10-point review of systems  is unremarkable.   PHYSICAL EXAMINATION:  GENERAL:  A young African American gentleman  sitting up on the stretcher in no acute distress at this time.  VITAL SIGNS:  Temperature 97.4, pulse 72, respirations 20, blood  pressure 140/85.  He is saturating 100% on room air.  HEENT:  His pupils are round and equal.  Mucous membranes are  pink.  His  face is puffy.  NECK:  No cervical lymphadenopathy.  No jugulovenous distention.  CHEST:  Clear to auscultation bilaterally.  CARDIOVASCULAR:  Regular rhythm without murmur.  ABDOMEN:  Diffusely tender.  EXTREMITIES:  Without edema.  CENTRAL NERVOUS SYSTEM:  Cranial nerves II through XII are grossly  intact and he has no focal neurologic deficits.   LABORATORY DATA:  His CBC is unremarkable.  His white count is only 8.6.  Hemoglobin 14.3, platelets 222.  Sodium is 136, potassium 3.8, chloride  104, CO2 of 24, glucose is 128, BUN 8, creatinine 0.7.  Liver function  tests are normal.  His alcohol level is undetectable.  His amylase is  756, his lipase is 258.   ASSESSMENT:  1. Acute pancreatitis.  2. Continued alcohol abuse.  3. Continued tobacco abuse.   PLAN:  Will admit him for IV fluids, pain control and once again counsel  him on the importance of tobacco and alcohol cessation.  Other plans as  per orders.      Thayer Ohm  Orvan Falconer, M.D.  Electronically Signed     LC/MEDQ  D:  04/28/2008  T:  04/28/2008  Job:  [id number]

## 2010-12-04 NOTE — Discharge Summary (Signed)
NAME:  [name] [name], [name] [name]                ACCOUNT NO.:  [id number]   MEDICAL RECORD NO.:  [id number]          PATIENT TYPE:  INP   LOCATION:  5127                         FACILITY:  [facility]   PHYSICIAN:  Peggye Pitt, M.D. DATE OF BIRTH:  [birth year]   DATE OF ADMISSION:  04/28/2008  DATE OF DISCHARGE:  05/06/2008                               DISCHARGE SUMMARY   DISCHARGE DIAGNOSES:  1. Recurrent pancreatitis.  2. Alcohol abuse.  3. Tobacco abuse.   DISCHARGE MEDICATIONS:  1. Thiamine 100 mg p.o. daily.  2. Folate 1 mg p.o. daily.   DISPOSITION AND FOLLOWUP:  The patient is discharged home in stable  condition.  His abdominal pain has improved significantly and he is now  eating a full diet.  As per Dr. Haywood Pao recommendations, he will follow  up with him in 1 week for an abdominal x-ray to make sure that the  pancreatic duct stent has been removed.  Dr. Haywood Pao office has stated  that they will call him with this appointment.   CONSULTATION THIS HOSPITALIZATION:  Jordan Hawks. Elnoria Howard, MD, with  Gastroenterology.   IMAGES AND PROCEDURES THIS HOSPITALIZATION:  CT scan of his abdomen and  pelvis on May 01, 2008, consistent with acute pancreatitis  superimposed and changes of chronic pancreatitis.  A large pancreatic  duct calculus with progressive dilatation of the pancreatic duct and  mild right basilar atelectasis.  He also had an ERCP on May 05, 2008, performed by Dr. Jeani Hawking, where there was definitely a fixed  filling defect in the proximal pancreatic duct; however, no stone was  identified and a pancreatic duct stent has been left in place.   HISTORY AND PHYSICAL EXAMINATION:  For full details, please refer to  history and physical dictated by Dr. Vania Rea on April 28, 2008, but in brief, [name] [name] is a 35 year old African American man  with history of alcohol, tobacco abuse, and recurrent pancreatitis who  is noncompliant with medical recommendations who  was just recently  discharged on April 11, 2008, after treatment for acute pancreatitis  and he returned after excessive drinking with abdominal pain and  vomiting.  This is why we were called to admit him.   HOSPITAL COURSE:  1. Acute-on-chronic pancreatitis, recurrent:  The patient was      initially kept n.p.o. and treated with pain control.  His diet was      slowly advanced as tolerated.  A CT scan was obtained that showed a      possible pancreatic duct calculus.  For that reason,      Gastroenterology was consulted.  Dr. Elnoria Howard performed an ERCP during      which there was a fixed filling defect of the pancreatic duct;      however, no calculus was seen, so a pancreatic duct stent was left      in place.  Dr. Elnoria Howard has stated that he will need an abdominal x-ray      in 1 week to ensure that the stent has fallen out of place, and he  will arrange for this to occur as well as followup.  2. Alcohol abuse.  The patient was kept on thiamine and folate while      in the hospital, having repeatedly encouraged on alcohol cessation.      I have asked case manager to assist with providing the patient with      the number for Alcoholics Anonymous and any other information on      alcohol cessation prior to discharge.   Vital signs on day of discharge; blood pressure 125/79, heart rate 79,  respirations 18, O2 sats 100% on room air with a temperature 98.8.   Labs on day of discharge; sodium 140, potassium 3.9, chloride 106,  bicarb 29, BUN 3, creatinine 0.88, glucose of 95, and a lipase of 18.      Peggye Pitt, M.D.  Electronically Signed     EH/MEDQ  D:  05/06/2008  T:  05/07/2008  Job:  [id number]   cc:   Jordan Hawks. Elnoria Howard, MD

## 2010-12-07 NOTE — Discharge Summary (Signed)
NAME:  [name] [name], [name] [name]                ACCOUNT NO.:  [id number]   MEDICAL RECORD NO.:  [id number]          PATIENT TYPE:  INP   LOCATION:  1323                         FACILITY:  [facility]   PHYSICIAN:  Hillery Aldo, M.D.   DATE OF BIRTH:  20-Oct-1975   DATE OF ADMISSION:  11/24/2006  DATE OF DISCHARGE:  11/28/2006                               DISCHARGE SUMMARY   PRIMARY CARE PHYSICIAN:  The patient is unassigned.   DISCHARGE DIAGNOSES:  1. Acute pancreatitis secondary to alcohol abuse.  2. Hypertension.  3. Alcoholism.  4. Cocaine abuse.  5. Tobacco abuse.   DISCHARGE MEDICATIONS:  1. Norvasc 5 mg daily.  2. Clonidine 0.1 mg t.i.d.  3. Vicodin 5/500 one to two tablets q.6h. p.r.n. pain.  4. Thiamine 100 mg daily.  5. Folic acid 1 mg daily.   CONSULTATIONS:  None.   BRIEF ADMISSION HISTORY OF PRESENT ILLNESS:  The patient is a 35-year-  old male admitted with three days of abdominal pain and vomiting for two  days.  He had a past medical history of acute pancreatitis secondary to  alcohol abuse.  Upon initial evaluation in the emergency department, he  was found to have evidence of recurrent pancreatitis and was therefore  admitted for further evaluation and treatment.  For the full details,  please see the admission HPI done by Dr. Brien Few.   PROCEDURES AND DIAGNOSTIC STUDIES:  CT scan of the abdomen on Nov 24, 2006 showed acute pancreatitis with peripancreatic edema and ascites.  There were no pseudocysts or pancreatic necrosis identified.   DISCHARGE LABORATORY VALUES:  Lipase was 86.  Sodium 139, potassium 4.1,  chloride 107, bicarb 26, BUN 6, creatinine 0.78, glucose 99.  White  blood cell count was 7.8, hemoglobin 12.6, hematocrit 37.1, platelets  184.   HOSPITAL COURSE BY PROBLEM:  PROBLEM #1 - ACUTE PANCREATITIS:  The  patient was admitted and given IV fluid hydration, put on bowel rest,  and put on a PCA morphine pump for pain control.  He was also given  antiemetics  and put on a proton pump inhibitor.  CT scanning was done  with findings as noted above.  With conservative treatment, the  patient's pain gradually improved and his diet was gradually advanced.  His IV pain medications were tapered off and he was able to tolerate a  regular diet prior to discharge.  The patient is therefore now stable  for discharge.  He is counseled about the importance of avoiding alcohol  to reduce the chance of a recurrent event.  The patient verbalized  understanding.   PROBLEM #2 -  HYPERTENSION:  The patient's blood pressure has been  poorly controlled in the outpatient setting secondary to issues with  compliance.  The patient was put on a combination of Norvasc and  clonidine which controlled his blood pressure fairly well.  He was  encouraged to continue taking these medications after discharge.   PROBLEM #3 - ALCOHOLISM:  The patient was given a referral to [facility] for  further consideration of outpatient treatment.  He did not experience  any obvious signs of withdrawal during the course of his  hospitalization.  He was put on thiamine and folate supplementation.   PROBLEM #4 -  COCAINE ABUSE:  The patient initially denied recent  cocaine abuse and was confronted with regard to the results of his urine  drug screen.  The patient was encouraged to avoid cocaine and was given  a referral to [facility] for further outpatient treatment.   PROBLEM #5 -  TOBACCO ABUSE:  The patient was given a tobacco cessation  brochure and encouraged to quit smoking.  While hospitalized, he was put  on nicotine patch.   DISPOSITION:  The patient is stable for discharge home.  He is  encouraged to follow up with [facility] for his ongoing medical care.  He is also encouraged to follow up with [facility] for  treatment of his polysubstance abuse.      Hillery Aldo, M.D.  Electronically Signed     CR/MEDQ  D:  11/28/2006  T:  11/28/2006  Job:  [id number]

## 2010-12-07 NOTE — Discharge Summary (Signed)
NAME:  [name] [name], [name] [name]                ACCOUNT NO.:  [id number]   MEDICAL RECORD NO.:  [id number]          PATIENT TYPE:  INP   LOCATION:  1502                         [facility]ACILITY:  [facility]   PHYSICIAN:  Wilson Singer, M.D.DATE OF BIRTH:  November 14, 1975   DATE OF ADMISSION:  05/20/2007  DATE OF DISCHARGE:  05/26/2007                               DISCHARGE SUMMARY   FINAL DISCHARGE DIAGNOSES:  1. Acute-on-chronic pancreatitis.  2. Elevated liver function tests.  3. Hypertension.  4. Alcoholism.   CONDITION ON DISCHARGE:  Stable.   MEDICATIONS ON DISCHARGE:  Catapres TTS 1 patch weekly, thiamine 100 mg  daily, Vicodin 1-2 tablets every 6 hours as needed.   CONDITION ON DISCHARGE:  Stable.   HISTORY:  This 35 year old man was admitted with pancreatitis.  Please  see initial history and physical examination.   HOSPITAL PROGRESS:  The patient was admitted and kept on bowel rest.  He  was seen by the gastroenterologist, who felt that he had chronic  pancreatitis with acute exacerbation and also alcoholism.  He does have  a history of heavy alcohol abuse.  The patient responded well to  conservative treatment.  He had an MRCP done while he was in the  hospital, which showed severe narrowing of the distal common bile duct,  felt to be due to pancreatic inflammation, but there was no filling  defect in the common bile duct, and therefore his diet was advanced and  he did well after this.  On the day of discharge, he was doing well and  tolerating a regular diet.  On examination, his temperature was 96.8,  blood pressure 132/67, pulse 59, saturation 99% on room air.  Abdomen  was soft and nontender.   Investigations showed a sodium 140, potassium 3.8, bicarbonate 29, BUN  3, creatinine 0.67, hemoglobin 11.5, white blood cell count 6.7,  platelets 394.   FURTHER DISPOSITION:  He will be discharged home today and follow up  with the primary care physician.  He has been told in no uncertain  terms  to quit drinking alcohol.      Wilson Singer, M.D.  Electronically Signed     NCG/MEDQ  D:  07/01/2007  T:  07/01/2007  Job:  [id number]

## 2010-12-07 NOTE — Discharge Summary (Signed)
NAME:  [name] [name], [name] [name]                ACCOUNT NO.:  [id number]   MEDICAL RECORD NO.:  [id number]          PATIENT TYPE:  INP   LOCATION:  3020                         FACILITY:  [facility]   PHYSICIAN:  Eliseo Gum, M.D.   DATE OF BIRTH:  October 21, 1975   DATE OF ADMISSION:  07/10/2006  DATE OF DISCHARGE:  07/13/2006                               DISCHARGE SUMMARY   DISCHARGE DIAGNOSES:  1. Pancreatitis secondary to alcohol.  2. Acetaminophen intoxication.  3. Polysubstance abuse.  4. Ileus secondary to pancreatitis, resolved.  5. Hypertension.  6. Mild hypokalemia secondary to vomiting, resolved.   DISCHARGE MEDICATIONS:  HCTZ 25 mg p.o. daily.   DISPOSITION AND FOLLOWUP:  Patient was discharged in hemodynamically  stable condition after his pancreatitis had resolved symptomatically and  after he completed his acetylcysteine treatment.  While in the hospital,  he received counseling regarding his polysubstance abuse and was  encouraged to discontinue all alcohol usage as well as cocaine and  narcotics.  Patient is to follow up at the [facility] Outpatient  Clinic at [facility], he will be contacted for a followup appointment.  Once he does follow up, check for blood pressure control, check a B-Met  for electrolytes and creatinine since he has been started on a diuretic.   PROCEDURES PERFORMED:  Abdominal ultrasound on December 21 revealed mild  proximal small-bowel ileus consistent with history of pancreatitis.   CONSULTATIONS:  None.   ADMISSION HISTORY:  Patient is a 35 year old man with past medical  history significant for alcohol abuse as well as two episodes of  pancreatitis in 2007 and presented to the [facility] emergency  department with complaints of stomach pain.  Three days prior to  admission, patient had a sudden onset of epigastric/periumbilical pain  stabbing in quality that radiated to both of his sides.  It started  3/10, but had progressed to 10/10 early on  morning of admission, pain  had been consistent and worsened by drinking fluids.  Pain was relieved  by Dilaudid in the emergency department.  Patient had not tolerated food  since three days prior to admission and had not had a bowel movement in  that time either.  Patient had four episodes of vomiting on morning of  admission without hematemesis, but he did admit to coffee ground.  Per  his report, he took one bottle of Tylenol which consisted of six tabs  of Extra-Strength Tylenol x3 days.  His last alcohol use was three days  prior to admission when he drank a 12-pack of beer.  Patient reports  that was his first drink since his last discharge in October of 2007.   ADMISSION PHYSICAL EXAMINATION:  VITAL SIGNS:  Temperature 97, blood  pressure 171/94, heart rate 76, respiratory rate 12, O2 sat 99% on room  air.  GENERAL:  Uncomfortable-appearing man that could not stay in place  because of pain.  HEENT:  PERRLA/EOMI.  Oropharynx clear.  Left cheek and eyebrow scar.  NECK:  Supple, no lymphadenopathy or thyromegaly, tattoo.  RESPIRATORY:  Lungs clear to auscultation bilaterally.  CARDIOVASCULAR:  Regular rate and rhythm, no murmurs, rubs or gallops.  ABDOMEN:  Bowel sounds negative, nondistended, voluntary guarding and  mild rebound tenderness diffusely, no palpable masses or organomegaly.  Murphy negative, McBurney negative.  EXTREMITIES:  No edema, cyanosis or clubbing.  Pulses 2+ bilaterally.  NEURO:  Nonfocal.   ADMISSION LABS:  Sodium 136, potassium 3.4, chloride 103, CO2 27, BUN 4,  creatinine 0.8, glucose 135, WBC 9.0 with ANC of 7.1, hemoglobin 14.1,  platelets 198, MCV 92.9, RDW 13.6, bilirubin .8, alk phos 53, AST 34,  ALT 34, protein 7.0, albumin 3.7, calcium 9.0, lipase 699, alcohol below  5, UDS positive for cocaine and opiates.   HOSPITAL COURSE:  1. Pancreatitis.  Given his presentation of abdominal pain with      nausea, a lipase level was checked and it was  elevated at 699, his      amylase was 163.  This was consistent with pancreatitis so patient      was admitted to a regular bed since he was not hemodynamically      unstable.  To further investigate the etiology of his pancreatitis,      an abdominal ultrasound was done on day number two of hospital stay      to rule out gallstones.  Since gallstones were not present on      ultrasound and since a fasting lipid profile revealed triglycerides      of only 62, it was safe to assume that the etiology of his      pancreatitis was alcohol.  Patient was; thus, put NPO on admission      and received supportive care.  He was hydrated and provided with      morphine for pain relief.  He only had two episodes of vomiting      after admission.  He was able to discontinue the usage of morphine      sulfate on hospital day number three after which time he tolerated      p.o. intake and was able to go home on hospital day number four.      [name] condition did not require antibiotics since he was not      febrile and had no leukocytosis.  2. Polysubstance abuse.  On admission, patient admitted to having      relapse three days prior to his ER visit.  He drank one 12-pack of      beer after having been sober for about a month-and-a-half.  His      alcohol level was below 5 on admission and despite the fact that he      had never experienced withdrawal or delirium tremens in the past,      he was placed on the CIWA protocol to monitor for symptoms of      withdrawal.  Patient did not require any benzodiazepines.  He was      kept on thiamine and folate as prophylaxis while in the hospital.      His urine drug screen was positive for cocaine as previously      mentioned.  An HIV test was done which was negative and a hepatitis      panel was also done and it was also negative.  Needless to say,      counseling was given to patient in terms of the importance for him     to discontinue all alcohol use  since it has already precipitated  three episodes of pancreatitis.  Any further episode could increase      the likelihood that he develops chronic pancreatitis.  3. Mild ileus secondary to pancreatitis.  On admission, patient      complained that he had not had a bowel movement in the previous      three days.  On physical exam, no bowel sounds were heard.      Although his abdominal plain film was normal, the abdominal      ultrasound did reveal mild proximal small-bowel ileus consistent      with pancreatitis.  On hospital day number three as patient started      eating, he also received Dulcolax and sorbitol before he could have      a bowel movement.  4. Acetaminophen intoxication.  On admission, patient did mention that      he had taken one bottle of Tylenol in the previous three days.      As a measure of caution, an acetaminophen level was drawn and came      back 29.0.  His salicylate level was below 4.  The [facility] was contacted and they recommended that patient receives      acetylcysteine treatment since he had received a constant dose of      acetaminophen over three days and that it had been accompanied by      alcohol ingestion.  Patient; thus, received 40,000 mg of      acetylcysteine as a continuous perfusion.  Fortunately, his      creatinine remained stable at 0.8 and he had no increase in his      liver enzymes.  5. Hypertension.  On admission, patient's blood pressure was 171/94      and it remained elevated even after his pain was relieved and he      had not ingested cocaine for many hours.  He was started on HCTZ 25      mg p.o. daily to which he responded favorably.  He did not have      left ventricular hypertrophy on his admission EKG.  6. Mild hypokalemia.  On admission, patient's potassium was 3.4 which      was secondary to his vomiting, his potassium was repleted and      remained within normal range afterwards.   DISCHARGE LABS:   Sodium 141, potassium 3.7, chloride 110, CO2 21, BUN 4,  creatinine 0.8, glucose 87, calcium 8.8, WBC 9.8, hemoglobin 13.1,  platelets 171, bilirubin 0.9, alk phos 36, AST 16, ALT 20, protein 5.6,  albumin 2.6.   DISCHARGE VITALS:  Temperature 98.4, heart rate 70, respiratory rate 20,  blood pressure 153/88, O2 sat 98% on room air.      Olene Craven, M.D.  Electronically Signed      Eliseo Gum, M.D.  Electronically Signed    MC/MEDQ  D:  07/28/2006  T:  07/29/2006  Job:  [id number]   cc:   [facility]

## 2010-12-07 NOTE — Discharge Summary (Signed)
NAME:  [name] [name], [name] [name]                ACCOUNT NO.:  [id number]   MEDICAL RECORD NO.:  [id number]          PATIENT TYPE:  INP   LOCATION:  1320                         FACILITY:  [facility]   PHYSICIAN:  Lonia Blood, M.D.      DATE OF BIRTH:  February 22, 1976   DATE OF ADMISSION:  02/25/2008  DATE OF DISCHARGE:  03/04/2008                               DISCHARGE SUMMARY   PRIMARY CARE PHYSICIAN:  The patient is unassigned to [place].   DISCHARGE DIAGNOSES:  1. 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  2. Alcoholism.  3. Urinary tract infection.  4. Tobacco abuse.   DISCHARGE MEDICATIONS:  1. Thiamine 100 mg daily.  2. Folic acid 1 mg daily.   DISPOSITION:  The patient was discharged with extensive council on  alcoholism.   FOLLOW UP:  He was to follow with Dr. Stan Head of [facility] a week  after discharge.   PROCEDURES PERFORMED:  1. Abdominal x-ray on February 26, 2008 and February 25, 2008 that showed      negative findings.  2. CT abdomen and pelvis on February 26, 2008 showed recurrent acute on      chronic pancreatitis with retroperitoneal edema and ascites.  No      well formed pseudocyst.  No evidence of necrosis.  There were      multiple parenchymal calcifications as well as an apparent large      calculus extrinsically compressing the main pancreatic duct.  No      evidence of biliary dilatation or large vessel occlusion.  There      was pelvic ascites attributed to pancreatitis and no other      significant pelvic findings.  3. MRI of the abdomen with and without contrast showed acute on      chronic pancreatitis with degree of peripancreatic edema mildly      improved compared with a recent CT.  No pseudocyst abscess or      pancreatic necrosis identified.  There was continued moderate      dilatation of the pancreatic duct distal to the calcific density      within or immediately along the duct margins.  Prominence of the      pancreatic head inferiorly chronic, stable and  without differential      enhancement.   CONSULTATIONS:  Stan Head, MD, gastroenterology.   BRIEF HISTORY/PHYSICAL:  Please refer to dictated history and physical  by Dr. Midge Minium.  In short, however, the patient is a 32-year-  old gentleman that was referred to our service with known alcoholism.  The patient came in secondary to having abdominal pain.  He was  evaluated and found to have recurrence of his chronic pancreatitis with  elevated lipase and LFTs.  He also had evidence of UTI on admission and  subsequently admitted for further management.   HOSPITAL COURSE:  1. Chronic pancreatitis.  The patient was admitted, started on PCA      pump with Dilaudid, nausea control, n.p.o.  His pain gradually      improved.  GI was consulted and further testing  was ordered as      above.  No intervention was thought to be warranted that is      surgically.  [name], the patient was pain free.      His diet has been advanced and he was eating adequately.  2. Alcoholism.  The patient was counseled once again about alcoholism      and the need for him to stop drinking.  3. Urinary tract infection.  The patient was treated empirically for      urinary tract infection using Cipro.  4. Tobacco abuse.  He was given some nicotine patch in the hospital      and also counseled on substance use.      Lonia Blood, M.D.  Electronically Signed     LG/MEDQ  D:  05/10/2008  T:  05/10/2008  Job:  [id number]

## 2010-12-07 NOTE — Discharge Summary (Signed)
NAME:  [name] [name], [name] [name]                ACCOUNT NO.:  [id number]   MEDICAL RECORD NO.:  [id number]          PATIENT TYPE:  INP   LOCATION:  1507                         FACILITY:  [facility]   PHYSICIAN:  Wilson Singer, M.D.DATE OF BIRTH:  02-21-1976   DATE OF ADMISSION:  08/29/2007  DATE OF DISCHARGE:  09/02/2007                               DISCHARGE SUMMARY   FINAL DISCHARGE DIAGNOSES:  1. Acute on chronic pancreatitis.  2. Hypertension.  3. History of alcohol dependence.   CONDITION ON DISCHARGE:  Stable.   MEDICATIONS ON DISCHARGE:  None.   HISTORY:  This 35 year old man who has previous history of chronic  pancreatitis and a previous history of alcoholism was admitted with a  history of nausea and vomiting as well as abdominal pain.  Please see  initial history and physical examination done by Dr. Hannah Beat.   HOSPITAL PROGRESS:  When the patient was admitted his blood work showed  a lipase of 624.  He was treated conservatively with intravenous fluids  and pain control.  He progressed fairly well and was able to tolerate  fluids.  CT scan of the abdomen was done which did not show any  necrosis.  On the day of discharge he had been tolerating a full diet  and there was no nausea, vomiting or abdominal pain.   PHYSICAL EXAMINATION:  He was afebrile, blood pressure 148/73, pulse 72,  saturation 98% on room air.  His abdomen was soft and nontender.   His lipase was 63, sodium 138, potassium 3.6, bicarbonate 29, BUN 1,  creatinine 0.92.   FURTHER DISPOSITION:  He was able to be discharged home and has been  advised to follow up with his primary care physician.      Wilson Singer, M.D.  Electronically Signed     NCG/MEDQ  D:  11/16/2007  T:  11/16/2007  Job:  [id number]

## 2010-12-07 NOTE — Discharge Summary (Signed)
NAME:  [name] [name], [name] [name]                ACCOUNT NO.:  [id number]   MEDICAL RECORD NO.:  [id number]          PATIENT TYPE:  INP   LOCATION:  5713                         FACILITY:  [facility]   PHYSICIAN:  Mobolaji B. Bakare, M.D.DATE OF BIRTH:  04-Jun-1976   DATE OF ADMISSION:  04/26/2006  DATE OF DISCHARGE:  04/28/2006                                 DISCHARGE SUMMARY   PRIMARY DIAGNOSES:  1. Alcohol induced pancreatitis, resolved.  2. Alcohol abuse/intoxication.  3. Tobacco abuse.   PROCEDURES:  None.   BRIEF HISTORY:  [name] is a 35 year old African American male who  presented to the emergency room with chief complaint of abdominal pain,  which started about 6 a.m. on the day of admission.  Apparently, patient had  been drinking alcohol.  He has a history of recurrent pancreatitis, and  patient's lab work suggested active pancreatitis with lipase of 1112.  Liver  transaminases were normal.  He had an alcohol level of 0.221, and he was  admitted for stabilization.  He was started on alcohol withdrawal protocol,  and patient was kept n.p.o. IV fluids and given pain medication.   HOSPITAL COURSE:  Lipase was monitored daily, and it trended downwards.  On  the day of discharge, lipase was 56.  The patient was n.p.o. for  approximately 36 hours.  He was started on clear liquids, which he  tolerated.  [name], he was tolerating full liquid diet.  He has been advised to continue full liquid diet for the next 2 days and  advance diet as tolerated.  There is no abdominal pain, no more vomiting.   The patient was continued on alcohol withdrawal protocol, and he will be  discharged home on p.o. Ativan for the next 2 days.  He did not demonstrate  any sign of withdrawal.  He has been admonished to quit drinking and  smoking.  He was given telephone number to the [facility] for follow up  and alcohol rehabilitation.   The patient was discharged home on the following  medications:  1. Folic acid 1 mg daily.  2. Ativan 1 mg 2 times a day for 2 days.  3. Multivitamin 1 daily.  4. Thymine 100 mg daily.  5. Nicotine patch 21 mg daily.   Follow up at the [facility] for alcohol rehabilitation.  Telephone number was given to patient.   DISCHARGE LABORATORY DATA:  Lipase 56, white cell 7.7, hemoglobin 12.8,  hematocrit 37.8, platelets 180, MCV 96, sodium 138, potassium of 3.9,  chloride 106, CO2 26, glucose 82, BUN 8, creatinine 1.1, bilirubin 1.1,  alkaline phosphatase 46, AST 35, ALT 31, total protein 6.3, albumin 3.2,  calcium 8.7.   DISCHARGE CONDITION:  Stable.      Mobolaji B. Corky Downs, M.D.  Electronically Signed     MBB/MEDQ  D:  04/28/2006  T:  04/28/2006  Job:  [id number]

## 2010-12-07 NOTE — H&P (Signed)
NAME:  [name] [name], [name] [name]                ACCOUNT NO.:  [id number]   MEDICAL RECORD NO.:  [id number]          PATIENT TYPE:  INP   LOCATION:  0101                         FACILITY:  [facility]   PHYSICIAN:  Hettie Holstein, D.O.    DATE OF BIRTH:  02/23/1976   DATE OF ADMISSION:  11/19/2005  DATE OF DISCHARGE:                                HISTORY & PHYSICAL   PRIMARY CARE PHYSICIAN:  Unassigned.   CHIEF COMPLAINT:  Abdominal pain.   HISTORY OF PRESENT ILLNESS:  Mr. Hires is a pleasant 35 year old African-  American male who suffers from alcoholism and had developed abdominal pain  about two months ago similar to this, though he resolved at home and did not  seek medical care.  In any event, today he developed once again severe  diffuse abdominal pain, nausea and vomiting.  Presented to the emergency  department and was discovered to have an elevated lipase, an elevated WBC  and findings consistent with pancreatitis.  He has a history of alcoholism,  drinks about 12+ beers per day.  Last had alcohol yesterday, drank about 12  beers.  In any event, he has been vomiting.  He has been drinking every  since he was very young, quite many years now.   PAST MEDICAL HISTORY:  Unremarkable.  Denies surgery.  Denies medications at  home.  No known drug allergies.   SOCIAL HISTORY:  He works in Aeronautical engineer.  He smokes about a pack of  cigarettes per day.  He has five children.  He is not married.  His parents  live in [place].   ALLERGIES:  No known drug allergies.   FAMILY HISTORY:  Mother is in her 59s and father is in his 42s.  He does not  know if they have any illnesses.   REVIEW OF SYSTEMS:  The patient denied after multiple episodes of retching  some small amounts of blood in his emesis; however, this has not been  recurrent.  He has no blood in his stool or melena.  Further review is  unremarkable.   PHYSICAL EXAMINATION:  VITAL SIGNS:  The patient's temperature was 96.8,  blood  pressure 160/88, heart rate 83, respirations 26, O2 saturation 100% on  room air.  GENERAL:  The patient is alert.  He is restless and appears to be  uncomfortable and in pain.  HEENT:  Head is normocephalic and atraumatic.  Extraocular muscles intact.  NECK:  Supple, nontender.  No palpable thyromegaly or mass.  CARDIOVASCULAR:  Normal S1 and S2.  LUNGS:  Clear.  He exhibits normal effort.  There is no dullness to  percussion.  ABDOMEN:  Diffusely tender.   LABORATORY DATA:  Sodium 136, potassium 3.8, BUN 6, creatinine 0.9, glucose  130.  WBC 12.9, hemoglobin 14.4, platelet count 238,000, MCV of 94.  Lipase  733.  Urinalysis was unremarkable.  Chest x-ray and abdominal film revealed  no acute findings, some possible mild ileus discovered.   ASSESSMENT:  1.  Acute pancreatitis, likely due to alcoholism.  2.  Alcoholism with the risks of delirium tremors.  PLAN:  We will are going to hold Mr. Armijo at n.p.o. and administer IV  fluids and antiemetics and administer DVT prophylaxis.  Obtain counseling  for him.  Would proceed to CT evaluation of the abdomen if it does not  resolve with respect to symptoms or if he exhibits any inflammatory response  including fever or tachycardia or clinical decompensation.      Hettie Holstein, D.O.  Electronically Signed     ESS/MEDQ  D:  11/19/2005  T:  11/19/2005  Job:  [id number]

## 2010-12-07 NOTE — Discharge Summary (Signed)
NAME:  Strum, Jazier                ACCOUNT NO.:  [id number]   MEDICAL RECORD NO.:  [id number]          PATIENT TYPE:  INP   LOCATION:  1610                         FACILITY:  [facility]   PHYSICIAN:  Lonia Blood, M.D.       DATE OF BIRTH:  02/23/1976   DATE OF ADMISSION:  11/19/2005  DATE OF DISCHARGE:  11/22/2005                                 DISCHARGE SUMMARY   DISCHARGE DIAGNOSIS:  1.  Acute pancreatitis, resolving.  2.  Mild acute hepatitis secondary to alcohol.  3.  Alcoholism.  4.  Mild alcohol withdrawal.  5.  Mild ileus, resolved.   DISCHARGE MEDICATIONS:  1.  Vitamin B1 100 mg daily.  2.  Folic acid 1 mg daily.  3.  Omeprazole 20 mg daily for 1 week.  4.  Vicodin 5/500, one every six hours as needed for pain.  5.  Reglan 5 mg every six hours as needed for nausea.  6.  Valium 5 mg tablets, to take 1 tablet twice a day on Nov 22 2005 and Nov 23, 2005, and a tablet a day on Nov 24, 2005 and Nov 25, 2005.   CONDITION ON DISCHARGE:  The patient was discharged in good condition.  At  time of discharge, he had no nausea, no vomiting.  He was tolerating a clear  liquid diet.  He also reported almost no abdominal pain.  At the time of  discharge, the patient was alert, oriented to place, person, and time,  in  no state of delirium.   The patient was given instructions how to follow up with [facility] at [phone].  Call for an appointment.  He also was instructed not  drink any alcohol and to go to [employer].  The patient  was instructed to follow a low-fat diet and to eat small meals and more  frequent meals for the first week.   Procedures during this admission:  No procedures were done.   CONSULTATIONS:  No consultations were obtained.   For admission history and physical, refer to H&P done by Dr. Angelena Sole.   HOSPITAL COURSE:  Problem #1.  Acute pancreatitis.  Mr. Sharman was admitted  to the acute units of [facility].  He was  started on intravenous  fluids as well as intravenous Dilaudid.  He reported that he started feeling  better by hospital day number two,  and his opiates were reduced.  He  remained n.p.o. on Nov 19, 2005 and Nov 20, 2005.  On Nov 21, 2005, the patient  received a clear liquid diet which he tolerated without any complications.  On Nov 22, 2005, the patient was without any nausea, vomiting, or significant  abdominal pain.  He was ready for discharge.  The etiology of the patient's  pancreatitis is most likely related to alcohol abuse.  The patient did have  mild elevation in AST, ALT related to alcohol abuse but his alkaline  phosphatase and total bilirubin were within normal limits.  The patient also  had a triglyceride level checked  which was 61.  At this point in time, the  patient was instructed to abstain from alcohol and eat a low fat diet, at  least for the first week.  He will follow up at [facility].  Problem #2.  Alcoholism.  The patient was treated with diazepam to prevent  alcohol withdrawal.  He did not develop any symptoms of significant alcohol  withdrawal.  The patient did not have any delirium during this  hospitalization.  The patient was kept on thiamine and folic acid throughout  this hospitalization.  [name], Mr. Cassatt was advised not  to drink any alcohol and follow up with [facility].      Lonia Blood, M.D.  Electronically Signed     SL/MEDQ  D:  11/22/2005  T:  11/22/2005  Job:  [id number]   cc:   Dala Dock

## 2010-12-07 NOTE — H&P (Signed)
NAME:  [name] [name], [name] [name]                ACCOUNT NO.:  [id number]   MEDICAL RECORD NO.:  [id number]          PATIENT TYPE:  INP   LOCATION:  5713                         FACILITY:  [facility]   PHYSICIAN:  Hillery Aldo, M.D.   DATE OF BIRTH:  1976-02-28   DATE OF ADMISSION:  04/26/2006  DATE OF DISCHARGE:                                HISTORY & PHYSICAL   PRIMARY CARE PHYSICIAN:  The patient is unassigned but has been referred to  [facility] in the past.   CHIEF COMPLAINT:  Abdominal pain, vomiting.   HISTORY OF PRESENT ILLNESS:  The patient is a 35 year old male with a past  medical history of acute hepatitis and alcohol-induced pancreatitis back in  May 2007, for which she spent 4 days in the hospital.  He presents today  with a chief complaint of abdominal pain for approximately 4 hours duration  in the midepigastric area.  It was accompanied by cramping and is described  as being sharp and rated 10/10.  This has been accompanied by multiple  episodes of nausea and vomiting.  Vomitus has been mostly clear.  Denies any  hematemesis, hematochezia, or melena.  The patient has a heavy history of  alcohol use and is intoxicated at the present time.  He is admitted for  treatment of recurrent acute pancreatitis and mild alcohol-induced  hepatitis.   PAST MEDICAL HISTORY:  1. Acute pancreatic pancreatitis with hospital admission in May 2007.      Triglycerides were normal at that time.  2. Alcohol-induced hepatitis.  3. Alcohol abuse.   FAMILY HISTORY:  Is unremarkable.  Parents are in their 40s and healthy.   SOCIAL HISTORY:  The patient is single.  He works as a Administrator.  He  smokes about a pack of tobacco daily.  He has heavy alcohol use, up to 12  beers a day.  He has 5 offspring.   ALLERGIES:  Dilaudid causes itching.   CURRENT MEDICATIONS:  None.   REVIEW OF SYSTEMS:  Negative except as noted in the HPI.   PHYSICAL EXAM:  VITAL SIGNS:  Temperature 97.4, pulse 86,  respirations 20,  blood pressure 141/83, O2 saturation 96% on room air.  GENERAL:  Well-developed, well-nourished male in moderate distress,  secondary to active nausea and vomiting.  HEENT:  Normocephalic, atraumatic.  PERRL.  EOMI.  No scleral icterus.  Oropharynx is clear with moist mucous membranes.  NECK:  Supple, no thyromegaly, no lymphadenopathy, no jugular venous  distension.  CHEST:  Lungs clear to auscultation bilaterally with good air movement.  HEART:  Regular rate, rhythm.  No murmurs, rubs, gallops.  ABDOMEN:  Soft.  He is tender about epigastrium.  Abdomen is nondistended  with no masses, no hepatosplenomegaly, and no guarding.  There is mild  decreased bowel sounds in all four quadrants.  GENITOURINARY:  No CVA tenderness.  EXTREMITIES:  No clubbing, edema, cyanosis.  SKIN:  Warm and dry.  No rashes.  NEUROLOGIC:  The patient is alert and oriented x3.  He answers questions  appropriately.  Cranial nerves II-XII are grossly intact.  Nonfocal.  DATA REVIEW:  White blood cell count is 10.9, hemoglobin 14.3, hematocrit  41.3, platelets 239, absolute neutrophil count of 6.6.  Sodium is 137,  potassium 3.7, chloride 101, bicarb 23, BUN 7, creatinine 0.9, glucose 124,  calcium 9.2, total protein 7.4, albumin 3.8, AST 81, ALT 51, alkaline  phosphatase 49, total bilirubin 0.7.  PT 13.7, PTT 25.  Alcohol level is  221, lipase 1,112.   ASSESSMENT/PLAN:  1. Recurrent alcohol-induced pancreatitis and mild hepatitis:  This is a      recurrent episode secondary to alcohol abuse.  We will admit the      patient for IV rehydration while keeping him n.p.o. for bowel rest.  We      will administer pain medications with morphine IV p.r.n.  We will use      antiemetics including Phenergan, Reglan, and Zofran p.r.n.  We will      monitor his clinical course closely.  2. Alcohol abuse:  We will start the patient on an Ativan protocol for      detoxification.  Once his acute illness is  resolving, we will get a      social work consult for consideration of alcohol abuse treatment and      referral to alcoholics anonymous.  We will supplement him with thiamine      and folic acid.  We will monitor for signs of seizure or other symptoms      of alcohol withdrawal.  3. Prophylaxis:  Initiate GI prophylaxis with Protonix.  The patient is      ambulatory and should not need DVT prophylaxis, but if he becomes      significantly bed-bound, we will reconsider this.      Hillery Aldo, M.D.  Electronically Signed     CR/MEDQ  D:  04/26/2006  T:  04/27/2006  Job:  [id number]   cc:   Dala Dock

## 2010-12-07 NOTE — H&P (Signed)
NAME:  [name] [name], [name] [name]                ACCOUNT NO.:  [id number]   MEDICAL RECORD NO.:  [id number]          PATIENT TYPE:  INP   LOCATION:  0108                         FACILITY:  [facility]   PHYSICIAN:  Isidor Holts, M.D.  DATE OF BIRTH:  1975-07-31   DATE OF ADMISSION:  11/24/2006  DATE OF DISCHARGE:                              HISTORY & PHYSICAL   PRIMARY MEDICAL DOCTOR:  [facility].  The  patient is unassigned to [place] at [facility].   CHIEF COMPLAINT:  Abdominal pain for 3 days, vomiting for 2 days.   HISTORY OF PRESENT ILLNESS:  This is a 35 year old male.  For past  medical history, see below.  According to the patient, he was admitted  to [facility] under the teaching service from July 10, 2006, to July 13, 2006 for Acute pancreatitis, discharged in  satisfactory condition and has been quite well since. He, however,  continues to drink, although he states that he is down to about 2-3 cans  of beer per day.  His last drink was on November 19, 2006.  Approximately 3  days ago, he developed epigastric/supraumbilical abdominal pain,  no  radiation, which he describes as constant, although sometimes fluctuates  in intensity.  Has been unable to keep food down since.  As a matter of  fact, the last time he ate was on Nov 23, 2006.  He has vomited  intermittent for the past two days. Last vomiting was in a.m. of Nov 24, 2006.  Denies diarrhea.  Denies chest pain or shortness of breath.  Has  felt feverish occasionally but has no objective measurement of  temperature.  He eventually came to the emergency department.   PAST MEDICAL HISTORY:  1. Recurrent acute pancreatitis. Admitted for same in May 2007, and      December 2007.  2. Alcohol abuse.  3. Smoking history.  4. Prior history of alcoholic hepatitis.  5. Prior history of cocaine use.  6. Hypertension diagnosed December 2007.   MEDICATIONS:  None.  The patient was  discharged on Hydrochlorothiazide  following hospital admission on December 2007.  He claims he utilized  this for 3 months but never refilled the medication.   ALLERGIES:  DILAUDID causes pruritus.   REVIEW OF SYSTEMS:  Essentially as per HPI and chief complaint,  otherwise negative.   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  Smokes about 1-2 packs of  cigarettes per day, has done so since the age of 15 years.  He claims to  have cut down quite heavily in his alcohol use.  Used to drink up to 12  cans of beer a day but is now down to 2-3 cans of beer a day.  He claims  that his last drink was on November 19, 2006.  He used to utilize cocaine,  but claims that he has long since quit.   FAMILY HISTORY:  Noncontributory.  The patient's parents are in their  51s and are both  healthy.   PHYSICAL EXAMINATION:  VITALS:  Temperature 98.5, pulse 70 per minute  regular, respiratory rate 18, BP 159/92 mmHg, pulse oximeter 99% on room  air.  GENERAL APPEARANCE:  The patient does not appear to be in obvious acute  distress, alert, communicative, not short of breath at rest.  HEENT: No clinical pallor, no jaundice.  No conjunctival injection.  Throat is clear.  NECK:  Supple.  JVP not seen.  No palpable lymphadenopathy.  No palpable  goiter.  No carotid bruits.  CHEST:  Clinically clear to auscultation.  No wheezes or crackles.  CARDIAC:  Heart sounds 1 and 2 heard, normal and regular.  No murmurs.  ABDOMEN:  Flat, soft, the patient has tenderness in the epigastrium.  No  palpable organomegaly.  No palpable masses.  Normal bowel sounds.  LOWER EXTREMITIES:  No pitting edema.  Palpable peripheral pulses.  MUSCULOSKELETAL:  Quite unremarkable.  CENTRAL NERVOUS SYSTEM:  No focal neurologic deficit on gross  examination.   INVESTIGATIONS:  CBC:  WBC 9.6, hemoglobin 15.2, hematocrit 44.4,  platelets 228.  Electrolytes:  Sodium  134, potassium 3.8, chloride 98,  CO2 27, BUN 9, creatinine 0.88, glucose 104, AST 47, ALT 61, alkaline  phosphatase 50, lipase 352.  Urinalysis negative.   ASSESSMENT AND PLAN:  1. Recurrent acute pancreatitis.  This is secondary to alcohol      use/abuse.  The patient's lipid profile during his last admission      appeared unremarkable.  We shall admit the patient, institute bowel      rest, proton pump inhibitor treatment, analgesics, intravenous      fluids and antiemetics.  We will also arrange abdominal CT scan      with contrast.   1. Alcohol abuse.  The patient continues to drink.  Clinically, he has      no evidence of withdrawal phenomena.  We shall utilize p.r.n.      Ativan should this be needed, place under observation, commence      vitamin supplementation and counsel appropriately.   1. Hypertension.  We shall restart antihypertensives, i.e., Clonidine      patch while patient is n.p.o. and certainly we shall avoid      Hydrochlorothiazide as this may predispose to pancreatitis.   1. Prior history of cocaine abuse.  The patient claims he has quit.      We shall check urine drug screen for completeness.   1. Smoking history.  We shall counsel appropriately and utilize      Nicoderm CQ patch.   Further management will depend on clinical course.      Isidor Holts, M.D.  Electronically Signed     CO/MEDQ  D:  11/24/2006  T:  11/24/2006  Job:  [id number]

## 2011-04-12 LAB — BASIC METABOLIC PANEL
BUN: 1 — ABNORMAL LOW
BUN: 5 — ABNORMAL LOW
Calcium: 8.9
Chloride: 103
Chloride: 104
Creatinine, Ser: 0.88
GFR calc Af Amer: >60
GFR calc Af Amer: >60
GFR calc non Af Amer: >60
GFR calc non Af Amer: >60
GFR calc non Af Amer: >60
Glucose, Bld: 104 — ABNORMAL HIGH
Glucose, Bld: 93
Potassium: 3.6
Potassium: 3.9
Sodium: 138
Sodium: 138

## 2011-04-12 LAB — CBC
HCT: 35.5 — ABNORMAL LOW
Hemoglobin: 12.2 — ABNORMAL LOW
MCV: 93
Platelets: 175
Platelets: 196
RBC: 3.8 — ABNORMAL LOW
RBC: 4.18 — ABNORMAL LOW
RDW: 13.8
WBC: 10.1
WBC: 10.2
WBC: 4.8

## 2011-04-12 LAB — COMPREHENSIVE METABOLIC PANEL
AST: 43 — ABNORMAL HIGH
Albumin: 4.1
Alkaline Phosphatase: 52
Chloride: 102
GFR calc Af Amer: >60
Potassium: 3.5
Total Bilirubin: 1
Total Protein: 7.8

## 2011-04-12 LAB — DIFFERENTIAL
Basophils Absolute: 0
Basophils Relative: 0
Eosinophils Relative: 0
Lymphocytes Relative: 13
Monocytes Absolute: 0.5
Monocytes Relative: 5

## 2011-04-12 LAB — LIPASE, BLOOD
Lipase: 324 — ABNORMAL HIGH
Lipase: 82 — ABNORMAL HIGH

## 2011-04-12 LAB — LIPID PANEL
Total CHOL/HDL Ratio: 4.9
Triglycerides: 105
VLDL: 21

## 2011-04-15 LAB — URINE MICROSCOPIC-ADD ON

## 2011-04-15 LAB — DIFFERENTIAL
Lymphocytes Relative: 18
Lymphs Abs: 2.2
Monocytes Absolute: 0.7
Monocytes Relative: 6
Neutro Abs: 8.7 — ABNORMAL HIGH

## 2011-04-15 LAB — CBC
HCT: 35.7 — ABNORMAL LOW
HCT: 43.8
Hemoglobin: 12.3 — ABNORMAL LOW
MCHC: 34.4
MCV: 94.2
MCV: 94.3
Platelets: 243
RDW: 14.1
RDW: 14.4

## 2011-04-15 LAB — URINALYSIS, ROUTINE W REFLEX MICROSCOPIC
Hgb urine dipstick: NEGATIVE
Nitrite: NEGATIVE
Specific Gravity, Urine: 1.027
Urobilinogen, UA: 1
pH: 6

## 2011-04-15 LAB — BASIC METABOLIC PANEL
CO2: 26
Chloride: 104
GFR calc non Af Amer: >60
Glucose, Bld: 98
Potassium: 3.5
Sodium: 138

## 2011-04-15 LAB — COMPREHENSIVE METABOLIC PANEL
AST: 30
Albumin: 4
BUN: 9
Creatinine, Ser: 0.89
GFR calc Af Amer: >60
Total Protein: 7.9

## 2011-04-19 LAB — POCT I-STAT, CHEM 8
Calcium, Ion: 1.16
Hemoglobin: 16
Sodium: 138
TCO2: 22

## 2011-04-19 LAB — HEPATIC FUNCTION PANEL
ALT: 59 — ABNORMAL HIGH
ALT: 60 — ABNORMAL HIGH
ALT: 41
AST: 42 — ABNORMAL HIGH
AST: 51 — ABNORMAL HIGH
AST: 40 — ABNORMAL HIGH
Albumin: 3.3 — ABNORMAL LOW
Albumin: 3.4 — ABNORMAL LOW
Albumin: 3.9
Alkaline Phosphatase: 41
Alkaline Phosphatase: 42
Alkaline Phosphatase: 46
Alkaline Phosphatase: 39
Alkaline Phosphatase: 44
Bilirubin, Direct: 0.1
Bilirubin, Direct: 0.2
Bilirubin, Direct: 0.2
Bilirubin, Direct: 0.2
Indirect Bilirubin: 0.6
Total Bilirubin: 0.7
Total Bilirubin: 0.9
Total Bilirubin: 0.9
Total Bilirubin: 0.7
Total Bilirubin: 0.8

## 2011-04-19 LAB — BASIC METABOLIC PANEL
CO2: 29
CO2: 31
CO2: 29
CO2: 29
Calcium: 9.3
Calcium: 9.5
Calcium: 8.6
Calcium: 9.4
Calcium: 9.4
Chloride: 106
Chloride: 106
Chloride: 106
Creatinine, Ser: 0.73
Creatinine, Ser: 0.78
Creatinine, Ser: 0.79
Creatinine, Ser: 0.86
GFR calc Af Amer: >60
GFR calc Af Amer: >60
GFR calc Af Amer: >60
GFR calc Af Amer: >60
GFR calc non Af Amer: >60
Glucose, Bld: 106 — ABNORMAL HIGH
Glucose, Bld: 93
Glucose, Bld: 118 — ABNORMAL HIGH
Sodium: 141
Sodium: 139
Sodium: 140
Sodium: 141

## 2011-04-19 LAB — GLUCOSE, CAPILLARY
Glucose-Capillary: 101 — ABNORMAL HIGH
Glucose-Capillary: 104 — ABNORMAL HIGH
Glucose-Capillary: 108 — ABNORMAL HIGH
Glucose-Capillary: 108 — ABNORMAL HIGH
Glucose-Capillary: 113 — ABNORMAL HIGH
Glucose-Capillary: 114 — ABNORMAL HIGH
Glucose-Capillary: 129 — ABNORMAL HIGH
Glucose-Capillary: 145 — ABNORMAL HIGH
Glucose-Capillary: 94

## 2011-04-19 LAB — CBC
HCT: 42.9
HCT: 43.1
Hemoglobin: 14.3
Hemoglobin: 12.6 — ABNORMAL LOW
Hemoglobin: 13.2
Hemoglobin: 13.4
MCHC: 33.1
MCHC: 33.4
MCHC: 32.9
MCHC: 33
MCHC: 33.3
MCHC: 33.7
MCV: 92.7
MCV: 93.9
MCV: 94.9
MCV: 95
MCV: 95.1
MCV: 96.1
Platelets: 173
Platelets: 218
Platelets: 276
RBC: 4.25
RBC: 4.63
RBC: 4 — ABNORMAL LOW
RBC: 4.11 — ABNORMAL LOW
RBC: 4.21 — ABNORMAL LOW
RDW: 13
RDW: 13.3
RDW: 13.4
RDW: 13.7
RDW: 13.8
WBC: 5.8
WBC: 6.6
WBC: 6.7
WBC: 8.6

## 2011-04-19 LAB — LIPID PANEL
Cholesterol: 143
HDL: 36 — ABNORMAL LOW
LDL Cholesterol: 86
Total CHOL/HDL Ratio: 4

## 2011-04-19 LAB — AMYLASE: Amylase: 652 — ABNORMAL HIGH

## 2011-04-19 LAB — URINALYSIS, ROUTINE W REFLEX MICROSCOPIC
Bilirubin Urine: NEGATIVE
Glucose, UA: NEGATIVE
Glucose, UA: NEGATIVE
Hgb urine dipstick: NEGATIVE
Hgb urine dipstick: NEGATIVE
Ketones, ur: NEGATIVE
Ketones, ur: NEGATIVE
Protein, ur: NEGATIVE
pH: 6

## 2011-04-19 LAB — RAPID URINE DRUG SCREEN, HOSP PERFORMED
Amphetamines: NOT DETECTED
Barbiturates: NOT DETECTED
Benzodiazepines: NOT DETECTED
Tetrahydrocannabinol: NOT DETECTED

## 2011-04-19 LAB — URINE MICROSCOPIC-ADD ON

## 2011-04-19 LAB — COMPREHENSIVE METABOLIC PANEL
ALT: 34
AST: 37
AST: 31
AST: 35
Albumin: 3.1 — ABNORMAL LOW
Albumin: 3.3 — ABNORMAL LOW
Albumin: 3.9
BUN: 4 — ABNORMAL LOW
BUN: 7
CO2: 25
Calcium: 8.9
Calcium: 9.5
Chloride: 106
Chloride: 111
Creatinine, Ser: 0.85
Creatinine, Ser: 0.79
Creatinine, Ser: 0.81
Creatinine, Ser: 0.86
GFR calc Af Amer: >60
GFR calc Af Amer: >60
GFR calc Af Amer: >60
GFR calc Af Amer: >60
GFR calc non Af Amer: >60
Glucose, Bld: 105 — ABNORMAL HIGH
Potassium: 4
Potassium: 3.8
Sodium: 139
Total Bilirubin: 1
Total Protein: 6.1
Total Protein: 6.3
Total Protein: 6.4
Total Protein: 7.5

## 2011-04-19 LAB — DIFFERENTIAL
Basophils Relative: 1
Eosinophils Absolute: 0.1
Eosinophils Relative: 1
Lymphocytes Relative: 16
Monocytes Absolute: 0.7
Monocytes Absolute: 0.6
Monocytes Relative: 9
Monocytes Relative: 4
Neutro Abs: 11.4 — ABNORMAL HIGH

## 2011-04-19 LAB — URINE CULTURE
Colony Count: NO GROWTH
Culture: NO GROWTH

## 2011-04-19 LAB — TSH: TSH: 2.968

## 2011-04-19 LAB — POTASSIUM: Potassium: 4.1

## 2011-04-19 LAB — MAGNESIUM
Magnesium: 1.8
Magnesium: 2

## 2011-04-19 LAB — LIPASE, BLOOD
Lipase: 954 — ABNORMAL HIGH
Lipase: 83 — ABNORMAL HIGH

## 2011-04-19 LAB — PHOSPHORUS: Phosphorus: 3.9

## 2011-04-22 LAB — BASIC METABOLIC PANEL
BUN: 3 — ABNORMAL LOW
CO2: 26
CO2: 26
Calcium: 9.3
Chloride: 106
GFR calc non Af Amer: >60
Glucose, Bld: 114 — ABNORMAL HIGH
Glucose, Bld: 114 — ABNORMAL HIGH
Glucose, Bld: 95
Potassium: 3.5
Potassium: 3.8
Potassium: 3.9
Sodium: 136
Sodium: 138
Sodium: 140

## 2011-04-22 LAB — GLUCOSE, CAPILLARY
Glucose-Capillary: 103 — ABNORMAL HIGH
Glucose-Capillary: 103 — ABNORMAL HIGH
Glucose-Capillary: 111 — ABNORMAL HIGH
Glucose-Capillary: 112 — ABNORMAL HIGH
Glucose-Capillary: 116 — ABNORMAL HIGH
Glucose-Capillary: 123 — ABNORMAL HIGH
Glucose-Capillary: 124 — ABNORMAL HIGH
Glucose-Capillary: 124 — ABNORMAL HIGH
Glucose-Capillary: 125 — ABNORMAL HIGH
Glucose-Capillary: 128 — ABNORMAL HIGH
Glucose-Capillary: 130 — ABNORMAL HIGH
Glucose-Capillary: 135 — ABNORMAL HIGH
Glucose-Capillary: 138 — ABNORMAL HIGH
Glucose-Capillary: 144 — ABNORMAL HIGH
Glucose-Capillary: 145 — ABNORMAL HIGH
Glucose-Capillary: 170 — ABNORMAL HIGH
Glucose-Capillary: 96
Glucose-Capillary: 98

## 2011-04-22 LAB — COMPREHENSIVE METABOLIC PANEL
ALT: 37
ALT: 31
ALT: 42
AST: 28
AST: 29
AST: 45 — ABNORMAL HIGH
AST: 54 — ABNORMAL HIGH
Albumin: 3.3 — ABNORMAL LOW
Albumin: 3.5
Albumin: 3.5
Albumin: 3.6
Albumin: 3.9
Alkaline Phosphatase: 47
Alkaline Phosphatase: 56
BUN: 9
BUN: 1 — ABNORMAL LOW
BUN: 3 — ABNORMAL LOW
CO2: 24
CO2: 28
CO2: 32
Calcium: 9.1
Calcium: 9.3
Calcium: 9.4
Calcium: 9.6
Chloride: 104
Chloride: 102
Chloride: 104
Creatinine, Ser: 0.76
Creatinine, Ser: 0.71
Creatinine, Ser: 0.8
Creatinine, Ser: 0.85
GFR calc Af Amer: >60
GFR calc Af Amer: >60
GFR calc Af Amer: >60
GFR calc Af Amer: >60
GFR calc non Af Amer: >60
GFR calc non Af Amer: >60
GFR calc non Af Amer: >60
GFR calc non Af Amer: >60
Glucose, Bld: 113 — ABNORMAL HIGH
Glucose, Bld: 101 — ABNORMAL HIGH
Glucose, Bld: 96
Potassium: 4
Potassium: 4
Sodium: 136
Sodium: 140
Sodium: 141
Total Bilirubin: 0.6
Total Bilirubin: 0.5
Total Bilirubin: 0.7
Total Bilirubin: 0.9
Total Protein: 6.4
Total Protein: 6.7
Total Protein: 6.7
Total Protein: 6.8

## 2011-04-22 LAB — CBC
HCT: 37.3 — ABNORMAL LOW
HCT: 37.9 — ABNORMAL LOW
HCT: 41.6
HCT: 37.9 — ABNORMAL LOW
HCT: 40.6
Hemoglobin: 12.6 — ABNORMAL LOW
Hemoglobin: 12.7 — ABNORMAL LOW
Hemoglobin: 14.1
Hemoglobin: 12.6 — ABNORMAL LOW
Hemoglobin: 13.4
MCHC: 33.3
MCHC: 33.4
MCHC: 33.3
MCHC: 33.4
MCV: 94.9
MCV: 95.9
MCV: 94.6
MCV: 95.2
MCV: 95.7
Platelets: 162
Platelets: 177
Platelets: 179
Platelets: 202
Platelets: 222
RBC: 4.37
RBC: 4.04 — ABNORMAL LOW
RBC: 4.33
RBC: 4.43
RDW: 13.6
RDW: 13.6
RDW: 13.3
RDW: 13.3
RDW: 13.7
WBC: 8.4
WBC: 6.2
WBC: 8.6

## 2011-04-22 LAB — ETHANOL
Alcohol, Ethyl (B): <5
Alcohol, Ethyl (B): <5

## 2011-04-22 LAB — LIPASE, BLOOD
Lipase: 210 — ABNORMAL HIGH
Lipase: 121 — ABNORMAL HIGH
Lipase: 265 — ABNORMAL HIGH
Lipase: 358 — ABNORMAL HIGH

## 2011-04-22 LAB — CULTURE, BLOOD (ROUTINE X 2): Culture: NO GROWTH

## 2011-04-22 LAB — DIFFERENTIAL
Basophils Absolute: 0
Basophils Relative: 0
Eosinophils Absolute: 0.1
Eosinophils Absolute: 0.2
Eosinophils Relative: 2
Lymphs Abs: 2.5
Monocytes Absolute: 0.6
Monocytes Absolute: 0.6
Neutro Abs: 6
Neutrophils Relative %: 72

## 2011-04-22 LAB — HEMOGLOBIN A1C: Mean Plasma Glucose: 120

## 2011-04-22 LAB — URINALYSIS, ROUTINE W REFLEX MICROSCOPIC
Bilirubin Urine: NEGATIVE
Hgb urine dipstick: NEGATIVE
Hgb urine dipstick: NEGATIVE
Ketones, ur: NEGATIVE
Ketones, ur: NEGATIVE
Protein, ur: NEGATIVE
Protein, ur: NEGATIVE
Urobilinogen, UA: 0.2
Urobilinogen, UA: 0.2

## 2011-04-22 LAB — RAPID URINE DRUG SCREEN, HOSP PERFORMED
Amphetamines: NOT DETECTED
Barbiturates: NOT DETECTED
Benzodiazepines: NOT DETECTED

## 2011-04-22 LAB — LIPID PANEL
Cholesterol: 142
Total CHOL/HDL Ratio: 4.7

## 2011-04-22 LAB — PHOSPHORUS: Phosphorus: 3.6

## 2011-04-22 LAB — URINE MICROSCOPIC-ADD ON

## 2011-04-22 LAB — MAGNESIUM
Magnesium: 2.1
Magnesium: 1.7

## 2011-04-30 LAB — COMPREHENSIVE METABOLIC PANEL
ALT: 218 — ABNORMAL HIGH
AST: 53 — ABNORMAL HIGH
AST: 74 — ABNORMAL HIGH
Albumin: 2.9 — ABNORMAL LOW
Alkaline Phosphatase: 90
BUN: 2 — ABNORMAL LOW
BUN: 3 — ABNORMAL LOW
CO2: 28
CO2: 29
Calcium: 9.3
Calcium: 9.4
Chloride: 107
Chloride: 108
Creatinine, Ser: 0.67
Creatinine, Ser: 0.79
GFR calc Af Amer: >60
GFR calc Af Amer: >60
GFR calc non Af Amer: >60
GFR calc non Af Amer: >60
Glucose, Bld: 104 — ABNORMAL HIGH
Glucose, Bld: 94
Potassium: 4
Sodium: 140
Total Bilirubin: 1
Total Bilirubin: 1.2
Total Protein: 6.4

## 2011-04-30 LAB — CBC
HCT: 33.9 — ABNORMAL LOW
HCT: 33.9 — ABNORMAL LOW
Hemoglobin: 11.5 — ABNORMAL LOW
Hemoglobin: 11.5 — ABNORMAL LOW
MCHC: 34
MCHC: 34.1
MCV: 95.1
MCV: 95.4
Platelets: 372
RBC: 3.56 — ABNORMAL LOW
RBC: 3.56 — ABNORMAL LOW
RDW: 13.3
WBC: 6.3
WBC: 6.7

## 2011-04-30 LAB — HEPATIC FUNCTION PANEL
ALT: 250 — ABNORMAL HIGH
AST: 102 — ABNORMAL HIGH
Albumin: 2.8 — ABNORMAL LOW
Albumin: 2.9 — ABNORMAL LOW
Alkaline Phosphatase: 110
Alkaline Phosphatase: 119 — ABNORMAL HIGH
Total Bilirubin: 1.2
Total Bilirubin: 1.3 — ABNORMAL HIGH
Total Protein: 6.1
Total Protein: 6.3

## 2011-04-30 LAB — BASIC METABOLIC PANEL
CO2: 24
Calcium: 8.9
Chloride: 107
GFR calc Af Amer: >60
Sodium: 137

## 2011-04-30 LAB — LIPASE, BLOOD: Lipase: 23

## 2011-05-01 LAB — DIFFERENTIAL
Basophils Absolute: 0
Basophils Absolute: 0.1
Basophils Relative: 0
Basophils Relative: 0
Eosinophils Absolute: 0
Eosinophils Absolute: 0.1
Eosinophils Relative: 0
Eosinophils Relative: 1
Eosinophils Relative: 2
Lymphocytes Relative: 14
Lymphocytes Relative: 20
Lymphocytes Relative: 31
Lymphs Abs: 2
Lymphs Abs: 2.5
Monocytes Absolute: 0.8 — ABNORMAL HIGH
Monocytes Absolute: 0.9 — ABNORMAL HIGH
Monocytes Absolute: 0.9 — ABNORMAL HIGH
Monocytes Absolute: 0.9 — ABNORMAL HIGH
Monocytes Relative: 10
Monocytes Relative: 12 — ABNORMAL HIGH
Monocytes Relative: 12 — ABNORMAL HIGH
Neutro Abs: 4.3
Neutro Abs: 5.2
Neutro Abs: 7.2
Neutrophils Relative %: 63

## 2011-05-01 LAB — COMPREHENSIVE METABOLIC PANEL
ALT: 25
ALT: 45
ALT: 483 — ABNORMAL HIGH
AST: 25
AST: 29
AST: 307 — ABNORMAL HIGH
AST: 78 — ABNORMAL HIGH
Albumin: 3.4 — ABNORMAL LOW
Albumin: 3.9
Albumin: 4.3
Alkaline Phosphatase: 129 — ABNORMAL HIGH
Alkaline Phosphatase: 49
Alkaline Phosphatase: 77
BUN: 2 — ABNORMAL LOW
BUN: 8
CO2: 22
CO2: 23
CO2: 28
Calcium: 9.1
Calcium: 9.6
Chloride: 102
Chloride: 105
Chloride: 106
Chloride: 108
Creatinine, Ser: 0.73
Creatinine, Ser: 0.79
Creatinine, Ser: 0.83
Creatinine, Ser: 0.84
Creatinine, Ser: 0.86
GFR calc Af Amer: >60
GFR calc Af Amer: >60
GFR calc Af Amer: >60
GFR calc Af Amer: >60
GFR calc Af Amer: >60
GFR calc non Af Amer: >60
GFR calc non Af Amer: >60
GFR calc non Af Amer: >60
Glucose, Bld: 114 — ABNORMAL HIGH
Potassium: 3.5
Potassium: 3.6
Potassium: 4.9
Sodium: 134 — ABNORMAL LOW
Sodium: 137
Sodium: 137
Total Bilirubin: 1.3 — ABNORMAL HIGH
Total Bilirubin: 1.6 — ABNORMAL HIGH
Total Bilirubin: 1.7 — ABNORMAL HIGH
Total Bilirubin: 4.8 — ABNORMAL HIGH
Total Protein: 7.1
Total Protein: 7.3
Total Protein: 7.7

## 2011-05-01 LAB — CBC
HCT: 35.1 — ABNORMAL LOW
HCT: 35.9 — ABNORMAL LOW
HCT: 38.1 — ABNORMAL LOW
Hemoglobin: 12.4 — ABNORMAL LOW
Hemoglobin: 12.9 — ABNORMAL LOW
MCHC: 33.8
MCHC: 33.9
MCHC: 34.3
MCHC: 34.4
MCV: 95.5
MCV: 95.5
Platelets: 203
Platelets: 246
Platelets: 322
Platelets: 329
Platelets: 335
RBC: 3.68 — ABNORMAL LOW
RBC: 3.85 — ABNORMAL LOW
RBC: 4 — ABNORMAL LOW
RBC: 4.51
RDW: 13.4
RDW: 13.4
RDW: 13.6
WBC: 10.4
WBC: 11.6 — ABNORMAL HIGH
WBC: 6.9
WBC: 7.9

## 2011-05-01 LAB — BASIC METABOLIC PANEL
BUN: 2 — ABNORMAL LOW
BUN: 4 — ABNORMAL LOW
CO2: 22
CO2: 27
Calcium: 9.2
Chloride: 108
Creatinine, Ser: 0.74
GFR calc non Af Amer: >60
Glucose, Bld: 71
Potassium: 3.8
Potassium: 4.5

## 2011-05-01 LAB — RAPID URINE DRUG SCREEN, HOSP PERFORMED
Amphetamines: NOT DETECTED
Amphetamines: NOT DETECTED
Barbiturates: NOT DETECTED
Benzodiazepines: NOT DETECTED
Opiates: POSITIVE — AB
Opiates: POSITIVE — AB
Tetrahydrocannabinol: NOT DETECTED

## 2011-05-01 LAB — HEPATIC FUNCTION PANEL
ALT: 428 — ABNORMAL HIGH
AST: 407 — ABNORMAL HIGH
Albumin: 3.2 — ABNORMAL LOW
Alkaline Phosphatase: 118 — ABNORMAL HIGH
Indirect Bilirubin: 1.3 — ABNORMAL HIGH
Total Bilirubin: 2.1 — ABNORMAL HIGH
Total Bilirubin: 3 — ABNORMAL HIGH
Total Protein: 6.7

## 2011-05-01 LAB — LIPASE, BLOOD
Lipase: 194 — ABNORMAL HIGH
Lipase: 33
Lipase: 78 — ABNORMAL HIGH

## 2011-05-01 LAB — PROTIME-INR: INR: 1

## 2011-05-01 LAB — MAGNESIUM: Magnesium: 1.8

## 2011-05-01 LAB — B-NATRIURETIC PEPTIDE (CONVERTED LAB): Pro B Natriuretic peptide (BNP): <30

## 2012-01-30 ENCOUNTER — Encounter (HOSPITAL_COMMUNITY): Payer: Self-pay

## 2012-01-30 ENCOUNTER — Emergency Department (HOSPITAL_COMMUNITY)
Admission: EM | Admit: 2012-01-30 | Discharge: 2012-01-31 | Disposition: A | Discharge status: Home / Self Care | Payer: Self-pay | Attending: Emergency Medicine | Admitting: Emergency Medicine

## 2012-01-30 DIAGNOSIS — F17200 Nicotine dependence, unspecified, uncomplicated: Secondary | ICD-10-CM | POA: Insufficient documentation

## 2012-01-30 DIAGNOSIS — F1010 Alcohol abuse, uncomplicated: Secondary | ICD-10-CM

## 2012-01-30 DIAGNOSIS — K2920 Alcoholic gastritis without bleeding: Secondary | ICD-10-CM | POA: Insufficient documentation

## 2012-01-30 DIAGNOSIS — F10229 Alcohol dependence with intoxication, unspecified: Secondary | ICD-10-CM | POA: Insufficient documentation

## 2012-01-30 HISTORY — DX: Acute pancreatitis without necrosis or infection, unspecified: K85.90

## 2012-01-30 LAB — COMPREHENSIVE METABOLIC PANEL
ALT: 37 U/L (ref 0–53)
AST: 34 U/L (ref 0–37)
Alkaline Phosphatase: 79 U/L (ref 39–117)
CO2: 24 mEq/L (ref 19–32)
Chloride: 101 mEq/L (ref 96–112)
GFR calc non Af Amer: >90 mL/min (ref >90)
Potassium: 3.5 mEq/L (ref 3.5–5.1)
Sodium: 134 mEq/L — ABNORMAL LOW (ref 135–145)
Total Bilirubin: 0.2 mg/dL — ABNORMAL LOW (ref 0.3–1.2)

## 2012-01-30 LAB — CBC WITH DIFFERENTIAL/PLATELET
Basophils Absolute: 0 10*3/uL (ref 0.0–0.1)
HCT: 40.6 % (ref 39.0–52.0)
Lymphocytes Relative: 31 % (ref 12–46)
Neutro Abs: 5.4 10*3/uL (ref 1.7–7.7)
Platelets: 242 10*3/uL (ref 150–400)
RBC: 4.42 MIL/uL (ref 4.22–5.81)
RDW: 13.1 % (ref 11.5–15.5)
WBC: 9 10*3/uL (ref 4.0–10.5)

## 2012-01-30 MED ORDER — SODIUM CHLORIDE 0.9 % IV BOLUS (SEPSIS)
1000.0000 mL | Freq: Once | INTRAVENOUS | Status: AC
  Administered 2012-01-30: 1000 mL via INTRAVENOUS

## 2012-01-30 MED ORDER — ONDANSETRON HCL 4 MG/2ML IJ SOLN
4.0000 mg | Freq: Once | INTRAMUSCULAR | Status: AC
  Administered 2012-01-30: 4 mg via INTRAVENOUS
  Filled 2012-01-30: qty 2

## 2012-01-30 MED ORDER — MORPHINE SULFATE 4 MG/ML IJ SOLN
6.0000 mg | Freq: Once | INTRAMUSCULAR | Status: AC
  Administered 2012-01-30: 6 mg via INTRAVENOUS
  Filled 2012-01-30: qty 2

## 2012-01-30 MED ORDER — SODIUM CHLORIDE 0.9 % IV SOLN
INTRAVENOUS | Status: DC
  Administered 2012-01-30: via INTRAVENOUS

## 2012-01-30 NOTE — ED Provider Notes (Signed)
History     CSN: [id number]  Arrival date & time 01/30/12  2123   First MD Initiated Contact with Patient 01/30/12 2154      Chief Complaint  Patient presents with  . Emesis  . Abdominal Pain    (Consider location/radiation/quality/duration/timing/severity/associated sxs/prior treatment) HPI Comments: Patients with history of chronic pancreatitis presents with epigastric abdominal pain, lower back pain, nausea and vomiting since this morning. Patient states that he drank a large amount of alcohol yesterday. He also admits to doing cocaine. Patient has had nonbloody, nonbilious vomiting. No chest pain or bowel changes. No history of abdominal surgery. Patient states that he had an episode of hematuria this morning. This has since resolved. Patient took a "pain pill" from a friend that did not help. Onset was gradual. Course is constant. Nothing makes symptoms better.  Patient is a 36 y.o. male presenting with abdominal pain. The history is provided by the patient.  Abdominal Pain The primary symptoms of the illness include abdominal pain, nausea and vomiting. The primary symptoms of the illness do not include fever, shortness of breath, diarrhea or dysuria. The current episode started 13 to 24 hours ago. The problem has not changed since onset. The illness is associated with alcohol use.    Past Medical History  Diagnosis Date  . Pancreatitis     Past Surgical History  Procedure Date  . Ankle fracture surgery     History reviewed. No pertinent family history.  History  Substance Use Topics  . Smoking status: Current Everyday Smoker -- 1.0 packs/day  . Smokeless tobacco: Not on file  . Alcohol Use: 7.2 oz/week    12 Cans of beer per week     daily      Review of Systems  Constitutional: Negative for fever.  HENT: Negative for sore throat and rhinorrhea.   Eyes: Negative for redness.  Respiratory: Negative for cough and shortness of breath.   Cardiovascular: Negative  for chest pain.  Gastrointestinal: Positive for nausea, vomiting and abdominal pain. Negative for diarrhea.  Genitourinary: Negative for dysuria.  Musculoskeletal: Negative for myalgias.  Skin: Negative for rash.  Neurological: Negative for headaches.    Allergies  Hydromorphone hcl  Home Medications  No current outpatient prescriptions on file.  BP 145/80  Pulse 102  Temp 98.2 F (36.8 C)  Resp 20  SpO2 100%  Physical Exam  Nursing note and vitals reviewed. Constitutional: He appears well-developed and well-nourished.  HENT:  Head: Normocephalic and atraumatic.  Eyes: Conjunctivae are normal. Right eye exhibits no discharge. Left eye exhibits no discharge.  Neck: Normal range of motion. Neck supple.  Cardiovascular: Normal rate, regular rhythm and normal heart sounds.   Pulmonary/Chest: Effort normal and breath sounds normal.  Abdominal: Soft. There is generalized tenderness. There is no rigidity, no rebound, no guarding, no CVA tenderness, no tenderness at McBurney's point and negative Murphy's sign.    Neurological: He is alert.  Skin: Skin is warm and dry.  Psychiatric: He has a normal mood and affect.    ED Course  Procedures (including critical care time)  Labs Reviewed  COMPREHENSIVE METABOLIC PANEL - Abnormal; Notable for the following:    Sodium 134 (*)     Glucose, Bld 139 (*)     Total Bilirubin 0.2 (*)     All other components within normal limits  URINALYSIS, ROUTINE W REFLEX MICROSCOPIC - Abnormal; Notable for the following:    Leukocytes, UA SMALL (*)     All  other components within normal limits  CBC WITH DIFFERENTIAL  LIPASE, BLOOD  URINE MICROSCOPIC-ADD ON   No results found.   1. Alcoholic gastritis   2. Alcohol abuse     10:31 PM Patient seen and examined. Work-up initiated. Medications ordered.   Vital signs reviewed and are as follows: Filed Vitals:   01/30/12 2138  BP: 145/80  Pulse: 102  Temp: 98.2 F (36.8 C)  Resp: 20    Additional dose of oral pain medication given. Patient requesting crackers.   4:03 AM Tolerated PO's, patient requesting discharge. Counseled on clears for next 24 hours.   The patient was urged to return to the Emergency Department immediately with worsening of current symptoms, worsening abdominal pain, persistent vomiting, blood noted in stools, fever, or any other concerns. The patient verbalized understanding.   Patient counseled on use of narcotic pain medications. Counseled not to combine these medications with others containing tylenol. Urged not to drink alcohol, drive, or perform any other activities that requires focus while taking these medications. The patient verbalizes understanding and agrees with the plan.  MDM  Abdominal pain, doubt pancreatitis given normal lipase. He is not persistently vomiting. Pain is controlled in ED. Pain most likely more related to gastritis given current exam and symptoms. Vitals stable.   Hematuria - not demonstrated in ED.   Polysubstance abuse - pt counseled on dangers and need to discontinue.         [place], [place] 01/31/12 [phone]

## 2012-01-30 NOTE — ED Notes (Signed)
Per pt, vomiting with abdominal pain since this am.  Bowels and Bladder normal.  No diarrhea.  Fever unknown.  Hx pancreatitis.  Last drink yesterday.

## 2012-01-30 NOTE — ED Notes (Signed)
Now pt stating urinated bright blood in urine this am x 1.  Now states also having back pain on both sides.  Urine did clear post this am void back to yellow

## 2012-01-30 NOTE — ED Notes (Signed)
RN to obtain labs with start of IV 

## 2012-01-31 LAB — URINALYSIS, ROUTINE W REFLEX MICROSCOPIC
Bilirubin Urine: NEGATIVE
Glucose, UA: NEGATIVE mg/dL
Hgb urine dipstick: NEGATIVE
Ketones, ur: NEGATIVE mg/dL
Protein, ur: NEGATIVE mg/dL

## 2012-01-31 LAB — URINE MICROSCOPIC-ADD ON

## 2012-01-31 MED ORDER — OXYCODONE-ACETAMINOPHEN 5-325 MG PO TABS
2.0000 | ORAL_TABLET | Freq: Once | ORAL | Status: AC
  Administered 2012-01-31: 2 via ORAL
  Filled 2012-01-31: qty 2

## 2012-01-31 MED ORDER — OXYCODONE-ACETAMINOPHEN 5-325 MG PO TABS: 1.0000 | ORAL_TABLET | Freq: Four times a day (QID) | ORAL | Status: AC | PRN

## 2012-01-31 MED ORDER — FAMOTIDINE 20 MG PO TABS
ORAL_TABLET | ORAL | Status: AC
  Administered 2012-01-31: 05:00:00
  Filled 2012-01-31: qty 1

## 2012-01-31 MED ORDER — OMEPRAZOLE 20 MG PO CPDR: DELAYED_RELEASE_CAPSULE | ORAL | Status: DC

## 2012-01-31 MED ORDER — FAMOTIDINE 20 MG PO TABS
40.0000 mg | ORAL_TABLET | Freq: Once | ORAL | Status: AC
  Administered 2012-01-31: 40 mg via ORAL
  Filled 2012-01-31: qty 2

## 2012-01-31 MED ORDER — SUCRALFATE 1 G PO TABS: 1.0000 g | ORAL_TABLET | Freq: Four times a day (QID) | ORAL | Status: DC

## 2012-02-01 NOTE — ED Provider Notes (Signed)
Medical screening examination/treatment/procedure(s) were conducted as a shared visit with non-physician practitioner(s) and myself.  I personally evaluated the patient during the encounter.  No acute abdomen. Lipase normal. Do not suspect pancreatitis.  Patient is hemodynamically stable  Donnetta Hutching, MD 02/01/12 [phone]

## 2012-05-09 ENCOUNTER — Emergency Department (HOSPITAL_COMMUNITY)
Admission: EM | Admit: 2012-05-09 | Discharge: 2012-05-09 | Discharge status: Against Medical Advice | Payer: Self-pay | Attending: Emergency Medicine | Admitting: Emergency Medicine

## 2012-05-09 ENCOUNTER — Encounter (HOSPITAL_COMMUNITY): Payer: Self-pay | Admitting: Emergency Medicine

## 2012-05-09 DIAGNOSIS — R109 Unspecified abdominal pain: Secondary | ICD-10-CM | POA: Insufficient documentation

## 2012-05-09 LAB — CBC WITH DIFFERENTIAL/PLATELET
Basophils Absolute: 0 10*3/uL (ref 0.0–0.1)
Basophils Relative: 0 % (ref 0–1)
Eosinophils Absolute: 0 10*3/uL (ref 0.0–0.7)
Eosinophils Relative: 0 % (ref 0–5)
HCT: 40.9 % (ref 39.0–52.0)
Hemoglobin: 14.5 g/dL (ref 13.0–17.0)
Lymphocytes Relative: 35 % (ref 12–46)
Lymphs Abs: 3.7 10*3/uL (ref 0.7–4.0)
MCH: 31.9 pg (ref 26.0–34.0)
MCHC: 35.5 g/dL (ref 30.0–36.0)
MCV: 89.9 fL (ref 78.0–100.0)
Monocytes Absolute: 0.6 10*3/uL (ref 0.1–1.0)
Monocytes Relative: 6 % (ref 3–12)
Neutro Abs: 6.2 10*3/uL (ref 1.7–7.7)
Neutrophils Relative %: 59 % (ref 43–77)
Platelets: 263 10*3/uL (ref 150–400)
RBC: 4.55 MIL/uL (ref 4.22–5.81)
RDW: 14.2 % (ref 11.5–15.5)
WBC: 10.6 10*3/uL — ABNORMAL HIGH (ref 4.0–10.5)

## 2012-05-09 LAB — URINALYSIS, ROUTINE W REFLEX MICROSCOPIC
Glucose, UA: NEGATIVE mg/dL
Ketones, ur: NEGATIVE mg/dL
Leukocytes, UA: NEGATIVE
Nitrite: NEGATIVE
Protein, ur: NEGATIVE mg/dL
pH: 6.5 (ref 5.0–8.0)

## 2012-05-09 LAB — BASIC METABOLIC PANEL
CO2: 25 mEq/L (ref 19–32)
Calcium: 9.6 mg/dL (ref 8.4–10.5)
Chloride: 104 mEq/L (ref 96–112)
Creatinine, Ser: 0.81 mg/dL (ref 0.50–1.35)
GFR calc Af Amer: >90 mL/min (ref >90)
Sodium: 139 mEq/L (ref 135–145)

## 2012-05-09 LAB — LIPASE, BLOOD: Lipase: 18 U/L (ref 11–59)

## 2012-05-09 LAB — URINE MICROSCOPIC-ADD ON

## 2012-05-09 NOTE — ED Notes (Signed)
Pt c/o upper abd pain with vomiting x 2 days from pancreatitis; pt sts hx of same

## 2013-12-12 ENCOUNTER — Encounter (HOSPITAL_COMMUNITY): Payer: Self-pay | Admitting: Emergency Medicine

## 2013-12-12 ENCOUNTER — Emergency Department (HOSPITAL_COMMUNITY)
Admission: EM | Admit: 2013-12-12 | Discharge: 2013-12-13 | Disposition: A | Discharge status: Home / Self Care | Payer: Self-pay | Attending: Emergency Medicine | Admitting: Emergency Medicine

## 2013-12-12 DIAGNOSIS — F17200 Nicotine dependence, unspecified, uncomplicated: Secondary | ICD-10-CM | POA: Insufficient documentation

## 2013-12-12 DIAGNOSIS — R112 Nausea with vomiting, unspecified: Secondary | ICD-10-CM

## 2013-12-12 DIAGNOSIS — K2920 Alcoholic gastritis without bleeding: Secondary | ICD-10-CM | POA: Insufficient documentation

## 2013-12-12 DIAGNOSIS — R109 Unspecified abdominal pain: Secondary | ICD-10-CM

## 2013-12-12 LAB — COMPREHENSIVE METABOLIC PANEL
ALK PHOS: 84 U/L (ref 39–117)
ALT: 45 U/L (ref 0–53)
AST: 51 U/L — ABNORMAL HIGH (ref 0–37)
Albumin: 3.7 g/dL (ref 3.5–5.2)
BUN: 10 mg/dL (ref 6–23)
CHLORIDE: 103 meq/L (ref 96–112)
CO2: 19 meq/L (ref 19–32)
Calcium: 9.5 mg/dL (ref 8.4–10.5)
Creatinine, Ser: 0.85 mg/dL (ref 0.50–1.35)
GFR calc non Af Amer: >90 mL/min (ref >90)
GLUCOSE: 123 mg/dL — AB (ref 70–99)
POTASSIUM: 4.3 meq/L (ref 3.7–5.3)
Sodium: 139 mEq/L (ref 137–147)
Total Protein: 8.1 g/dL (ref 6.0–8.3)

## 2013-12-12 LAB — CBC
HEMATOCRIT: 40.6 % (ref 39.0–52.0)
HEMOGLOBIN: 13.8 g/dL (ref 13.0–17.0)
MCH: 31.7 pg (ref 26.0–34.0)
MCHC: 34 g/dL (ref 30.0–36.0)
MCV: 93.1 fL (ref 78.0–100.0)
Platelets: 210 10*3/uL (ref 150–400)
RBC: 4.36 MIL/uL (ref 4.22–5.81)
RDW: 13.3 % (ref 11.5–15.5)
WBC: 8.5 10*3/uL (ref 4.0–10.5)

## 2013-12-12 LAB — LIPASE, BLOOD: LIPASE: 43 U/L (ref 11–59)

## 2013-12-12 LAB — URINALYSIS, ROUTINE W REFLEX MICROSCOPIC
BILIRUBIN URINE: NEGATIVE
Glucose, UA: NEGATIVE mg/dL
Hgb urine dipstick: NEGATIVE
KETONES UR: NEGATIVE mg/dL
Leukocytes, UA: NEGATIVE
NITRITE: NEGATIVE
Protein, ur: NEGATIVE mg/dL
Specific Gravity, Urine: 1.005 (ref 1.005–1.030)
UROBILINOGEN UA: 0.2 mg/dL (ref 0.0–1.0)
pH: 5.5 (ref 5.0–8.0)

## 2013-12-12 MED ORDER — ONDANSETRON HCL 4 MG/2ML IJ SOLN
4.0000 mg | Freq: Once | INTRAMUSCULAR | Status: AC
  Administered 2013-12-12: 4 mg via INTRAVENOUS
  Filled 2013-12-12: qty 2

## 2013-12-12 MED ORDER — SODIUM CHLORIDE 0.9 % IV BOLUS (SEPSIS)
1000.0000 mL | Freq: Once | INTRAVENOUS | Status: AC
Start: 2013-12-12 — End: 2013-12-12
  Administered 2013-12-12: 1000 mL via INTRAVENOUS

## 2013-12-12 MED ORDER — PANTOPRAZOLE SODIUM 40 MG PO TBEC
40.0000 mg | DELAYED_RELEASE_TABLET | Freq: Once | ORAL | Status: AC
  Administered 2013-12-13: 40 mg via ORAL
  Filled 2013-12-12: qty 1

## 2013-12-12 NOTE — ED Notes (Signed)
Pt A+ox4, reports c/o "my pancreatitis" x2 hours, +nausea, +abd pain, 8/10, mid abd.  Pt denies fevers/chills or other complaints.  Skin PWD.  Ambulatory with steady gait.  NAD.

## 2013-12-12 NOTE — ED Provider Notes (Signed)
CSN: [id number]     Arrival date & time 12/12/13  2206 History   First MD Initiated Contact with Patient 12/12/13 2227     Chief Complaint  Patient presents with  . Abdominal Pain  . Nausea     (Consider location/radiation/quality/duration/timing/severity/associated sxs/prior Treatment) HPI Comments: 6 six-year[age]old male with a past medical history pancreatitis presents to the emergency department complaining of gradual onset worsening midepigastric abdominal pain radiating towards his back x2 hours. Pain worsened by laying flat. Admits to associated nausea and a few episodes of nonbloody emesis. Patient states this feels similar to his prior flareups of pancreatitis. States he was drinking alcohol earlier today, admits drinking a sixpack of beer about 4 hours ago. He at one episode of diarrhea. Denies fever, chills, chest pain or shortness of breath. He has not had any alleviating factors for his symptoms.  Patient is a 38 y.o. male presenting with abdominal pain. The history is provided by the patient.  Abdominal Pain Associated symptoms: nausea and vomiting     Past Medical History  Diagnosis Date  . Pancreatitis    Past Surgical History  Procedure Laterality Date  . Ankle fracture surgery     No family history on file. History  Substance Use Topics  . Smoking status: Current Every Day Smoker -- 1.00 packs/day  . Smokeless tobacco: Not on file  . Alcohol Use: 7.2 oz/week    12 Cans of beer per week     Comment: daily    Review of Systems  Gastrointestinal: Positive for nausea, vomiting and abdominal pain.  All other systems reviewed and are negative.     Allergies  Hydromorphone hcl  Home Medications   Prior to Admission medications   Not on File   BP 131/83  Pulse 104  Temp(Src) 97.9 F (36.6 C) (Oral)  Resp 16  Ht 6\' 1"  (1.854 m)  Wt 180 lb (81.647 kg)  BMI 23.75 kg/m2  SpO2 99% Physical Exam  Nursing note and vitals reviewed. Constitutional: He is  oriented to person, place, and time. He appears well-developed and well-nourished. No distress.  Appears uncomfortable but in NAD.  HENT:  Head: Normocephalic and atraumatic.  Mouth/Throat: Oropharynx is clear and moist.  Eyes: Conjunctivae are normal. No scleral icterus.  Neck: Normal range of motion. Neck supple.  Cardiovascular: Normal rate, regular rhythm and normal heart sounds.   Pulmonary/Chest: Effort normal and breath sounds normal.  Abdominal: Soft. Normal appearance and bowel sounds are normal. He exhibits no distension. There is tenderness in the epigastric area. There is guarding. There is no rigidity and no rebound.  No peritoneal signs.  Musculoskeletal: Normal range of motion. He exhibits no edema.  Neurological: He is alert and oriented to person, place, and time.  Skin: Skin is warm and dry. He is not diaphoretic.  Psychiatric: He has a normal mood and affect. His behavior is normal.    ED Course  Procedures (including critical care time) Labs Review Labs Reviewed  COMPREHENSIVE METABOLIC PANEL - Abnormal; Notable for the following:    Glucose, Bld 123 (*)    AST 51 (*)    Total Bilirubin <0.2 (*)    All other components within normal limits  CBC  LIPASE, BLOOD  URINALYSIS, ROUTINE W REFLEX MICROSCOPIC    Imaging Review No results found.   EKG Interpretation None      MDM   Final diagnoses:  Alcoholic gastritis  Abdominal pain  Nausea and vomiting    Patient presenting  with abdominal pain, midepigastric, beginning after drinking alcohol. History of pancreatitis. Probable pancreatitis flare. Labs pending. Plan to give IV fluids and symptom control. 11:50 PM Labs showing mildly elevated AST at 51, otherwise normal. Lipase within normal limits. Most likely alcoholic gastritis. Will give PO protonix and PO challenge. 12:38 AM Pt tolerated PO. Abdomen remains soft, still tender, however improvement from initial exam. Pt stable for d/c. Advised against  drinking alcohol. Return precautions given. Patient states understanding of treatment care plan and is agreeable.  Trevor MaceRobyn M Albert, PA-C 12/13/13 [phone]

## 2013-12-13 MED ORDER — ONDANSETRON 4 MG PO TBDP: ORAL_TABLET | ORAL | Status: DC

## 2013-12-13 MED ORDER — PANTOPRAZOLE SODIUM 20 MG PO TBEC: 20.0000 mg | DELAYED_RELEASE_TABLET | Freq: Every day | ORAL | Status: DC

## 2013-12-13 NOTE — ED Provider Notes (Signed)
Medical screening examination/treatment/procedure(s) were performed by non-physician practitioner and as supervising physician I was immediately available for consultation/collaboration.   EKG Interpretation None        Lyanne Co, MD 12/13/13 0140

## 2013-12-13 NOTE — Discharge Instructions (Signed)
Take pepcid as prescribed to help your stomach symptoms. Take zofran as directed as needed for nausea. Avoid alcohol.  Gastritis, Adult Gastritis is soreness and swelling (inflammation) of the lining of the stomach. Gastritis can develop as a sudden onset (acute) or long-term (chronic) condition. If gastritis is not treated, it can lead to stomach bleeding and ulcers. CAUSES  Gastritis occurs when the stomach lining is weak or damaged. Digestive juices from the stomach then inflame the weakened stomach lining. The stomach lining may be weak or damaged due to viral or bacterial infections. One common bacterial infection is the Helicobacter pylori infection. Gastritis can also result from excessive alcohol consumption, taking certain medicines, or having too much acid in the stomach.  SYMPTOMS  In some cases, there are no symptoms. When symptoms are present, they may include:  Pain or a burning sensation in the upper abdomen.  Nausea.  Vomiting.  An uncomfortable feeling of fullness after eating. DIAGNOSIS  Your caregiver may suspect you have gastritis based on your symptoms and a physical exam. To determine the cause of your gastritis, your caregiver may perform the following:  Blood or stool tests to check for the H pylori bacterium.  Gastroscopy. A thin, flexible tube (endoscope) is passed down the esophagus and into the stomach. The endoscope has a light and camera on the end. Your caregiver uses the endoscope to view the inside of the stomach.  Taking a tissue sample (biopsy) from the stomach to examine under a microscope. TREATMENT  Depending on the cause of your gastritis, medicines may be prescribed. If you have a bacterial infection, such as an H pylori infection, antibiotics may be given. If your gastritis is caused by too much acid in the stomach, H2 blockers or antacids may be given. Your caregiver may recommend that you stop taking aspirin, ibuprofen, or other nonsteroidal  anti-inflammatory drugs (NSAIDs). HOME CARE INSTRUCTIONS  Only take over-the-counter or prescription medicines as directed by your caregiver.  If you were given antibiotic medicines, take them as directed. Finish them even if you start to feel better.  Drink enough fluids to keep your urine clear or pale yellow.  Avoid foods and drinks that make your symptoms worse, such as:  Caffeine or alcoholic drinks.  Chocolate.  Peppermint or mint flavorings.  Garlic and onions.  Spicy foods.  Citrus fruits, such as oranges, lemons, or limes.  Tomato-based foods such as sauce, chili, salsa, and pizza.  Fried and fatty foods.  Eat small, frequent meals instead of large meals. SEEK IMMEDIATE MEDICAL CARE IF:   You have black or dark red stools.  You vomit blood or material that looks like coffee grounds.  You are unable to keep fluids down.  Your abdominal pain gets worse.  You have a fever.  You do not feel better after 1 week.  You have any other questions or concerns. MAKE SURE YOU:  Understand these instructions.  Will watch your condition.  Will get help right away if you are not doing well or get worse. Document Released: 07/02/2001 Document Revised: 01/07/2012 Document Reviewed: 08/21/2011 [facility] Patient Information 2014 [employer] [place].  Abdominal Pain, Adult Many things can cause abdominal pain. Usually, abdominal pain is not caused by a disease and will improve without treatment. It can often be observed and treated at home. Your health care provider will do a physical exam and possibly order blood tests and X-rays to help determine the seriousness of your pain. However, in many cases, more time must  pass before a clear cause of the pain can be found. Before that point, your health care provider may not know if you need more testing or further treatment. HOME CARE INSTRUCTIONS  Monitor your abdominal pain for any changes. The following actions may help to  alleviate any discomfort you are experiencing:  Only take over-the-counter or prescription medicines as directed by your health care provider.  Do not take laxatives unless directed to do so by your health care provider.  Try a clear liquid diet (broth, tea, or water) as directed by your health care provider. Slowly move to a bland diet as tolerated. SEEK MEDICAL CARE IF:  You have unexplained abdominal pain.  You have abdominal pain associated with nausea or diarrhea.  You have pain when you urinate or have a bowel movement.  You experience abdominal pain that wakes you in the night.  You have abdominal pain that is worsened or improved by eating food.  You have abdominal pain that is worsened with eating fatty foods. SEEK IMMEDIATE MEDICAL CARE IF:   Your pain does not go away within 2 hours.  You have a fever.  You keep throwing up (vomiting).  Your pain is felt only in portions of the abdomen, such as the right side or the left lower portion of the abdomen.  You pass bloody or black tarry stools. MAKE SURE YOU:  Understand these instructions.   Will watch your condition.   Will get help right away if you are not doing well or get worse.  Document Released: 04/17/2005 Document Revised: 04/28/2013 Document Reviewed: 03/17/2013 [facility] Patient Information 2014 [place], [place].

## 2013-12-27 ENCOUNTER — Emergency Department (HOSPITAL_COMMUNITY)
Admission: EM | Admit: 2013-12-27 | Discharge: 2013-12-28 | Disposition: A | Discharge status: Home / Self Care | Payer: Self-pay | Attending: Emergency Medicine | Admitting: Emergency Medicine

## 2013-12-27 ENCOUNTER — Encounter (HOSPITAL_COMMUNITY): Payer: Self-pay | Admitting: Emergency Medicine

## 2013-12-27 DIAGNOSIS — R1013 Epigastric pain: Secondary | ICD-10-CM | POA: Insufficient documentation

## 2013-12-27 DIAGNOSIS — R112 Nausea with vomiting, unspecified: Secondary | ICD-10-CM | POA: Insufficient documentation

## 2013-12-27 DIAGNOSIS — R109 Unspecified abdominal pain: Secondary | ICD-10-CM

## 2013-12-27 DIAGNOSIS — Z8719 Personal history of other diseases of the digestive system: Secondary | ICD-10-CM | POA: Insufficient documentation

## 2013-12-27 DIAGNOSIS — R1110 Vomiting, unspecified: Secondary | ICD-10-CM

## 2013-12-27 DIAGNOSIS — F1010 Alcohol abuse, uncomplicated: Secondary | ICD-10-CM | POA: Insufficient documentation

## 2013-12-27 DIAGNOSIS — F17200 Nicotine dependence, unspecified, uncomplicated: Secondary | ICD-10-CM | POA: Insufficient documentation

## 2013-12-27 NOTE — ED Notes (Signed)
Patient is alert and oriented x3.  He is complaining of abdominal pain that started in his abdominal  Area and radiates around both sides to his back.  Patient states that he was drinking beer last night  And started vomiting When the pain stared.  Currently he rates his pain 8 of 10 with nausea.

## 2013-12-28 LAB — URINALYSIS, ROUTINE W REFLEX MICROSCOPIC
BILIRUBIN URINE: NEGATIVE
Glucose, UA: NEGATIVE mg/dL
Hgb urine dipstick: NEGATIVE
Ketones, ur: NEGATIVE mg/dL
NITRITE: NEGATIVE
PH: 5.5 (ref 5.0–8.0)
Protein, ur: NEGATIVE mg/dL
SPECIFIC GRAVITY, URINE: 1.01 (ref 1.005–1.030)
UROBILINOGEN UA: 0.2 mg/dL (ref 0.0–1.0)

## 2013-12-28 LAB — COMPREHENSIVE METABOLIC PANEL
ALT: 51 U/L (ref 0–53)
AST: 58 U/L — ABNORMAL HIGH (ref 0–37)
Albumin: 4 g/dL (ref 3.5–5.2)
Alkaline Phosphatase: 65 U/L (ref 39–117)
BUN: 12 mg/dL (ref 6–23)
CALCIUM: 9.6 mg/dL (ref 8.4–10.5)
CO2: 23 mEq/L (ref 19–32)
CREATININE: 0.79 mg/dL (ref 0.50–1.35)
Chloride: 102 mEq/L (ref 96–112)
GLUCOSE: 101 mg/dL — AB (ref 70–99)
Potassium: 4.1 mEq/L (ref 3.7–5.3)
SODIUM: 136 meq/L — AB (ref 137–147)
TOTAL PROTEIN: 7.9 g/dL (ref 6.0–8.3)
Total Bilirubin: 0.5 mg/dL (ref 0.3–1.2)

## 2013-12-28 LAB — CBC WITH DIFFERENTIAL/PLATELET
Basophils Absolute: 0 10*3/uL (ref 0.0–0.1)
Basophils Relative: 0 % (ref 0–1)
EOS ABS: 0.1 10*3/uL (ref 0.0–0.7)
EOS PCT: 1 % (ref 0–5)
HEMATOCRIT: 40.7 % (ref 39.0–52.0)
Hemoglobin: 13.7 g/dL (ref 13.0–17.0)
LYMPHS ABS: 3 10*3/uL (ref 0.7–4.0)
Lymphocytes Relative: 34 % (ref 12–46)
MCH: 31.4 pg (ref 26.0–34.0)
MCHC: 33.7 g/dL (ref 30.0–36.0)
MCV: 93.3 fL (ref 78.0–100.0)
MONO ABS: 0.9 10*3/uL (ref 0.1–1.0)
Monocytes Relative: 10 % (ref 3–12)
Neutro Abs: 4.8 10*3/uL (ref 1.7–7.7)
Neutrophils Relative %: 55 % (ref 43–77)
PLATELETS: 202 10*3/uL (ref 150–400)
RBC: 4.36 MIL/uL (ref 4.22–5.81)
RDW: 13.3 % (ref 11.5–15.5)
WBC: 8.8 10*3/uL (ref 4.0–10.5)

## 2013-12-28 LAB — URINE MICROSCOPIC-ADD ON

## 2013-12-28 LAB — LIPASE, BLOOD: Lipase: 26 U/L (ref 11–59)

## 2013-12-28 MED ORDER — SODIUM CHLORIDE 0.9 % IV BOLUS (SEPSIS)
1000.0000 mL | Freq: Once | INTRAVENOUS | Status: AC
  Administered 2013-12-28: 1000 mL via INTRAVENOUS

## 2013-12-28 MED ORDER — ONDANSETRON HCL 4 MG PO TABS: 4.0000 mg | ORAL_TABLET | Freq: Four times a day (QID) | ORAL | Status: DC

## 2013-12-28 MED ORDER — MORPHINE SULFATE 4 MG/ML IJ SOLN
8.0000 mg | Freq: Once | INTRAMUSCULAR | Status: AC
  Administered 2013-12-28: 8 mg via INTRAVENOUS
  Filled 2013-12-28: qty 2

## 2013-12-28 MED ORDER — ONDANSETRON HCL 4 MG/2ML IJ SOLN
4.0000 mg | Freq: Once | INTRAMUSCULAR | Status: AC
  Administered 2013-12-28: 4 mg via INTRAVENOUS
  Filled 2013-12-28: qty 2

## 2013-12-28 MED ORDER — ACETAMINOPHEN 325 MG PO TABS
650.0000 mg | ORAL_TABLET | Freq: Once | ORAL | Status: AC
  Administered 2013-12-28: 650 mg via ORAL
  Filled 2013-12-28: qty 2

## 2013-12-28 MED ORDER — KETOROLAC TROMETHAMINE 30 MG/ML IJ SOLN
30.0000 mg | Freq: Once | INTRAMUSCULAR | Status: AC
  Administered 2013-12-28: 30 mg via INTRAVENOUS
  Filled 2013-12-28: qty 1

## 2013-12-28 NOTE — Discharge Instructions (Signed)
If you were given medicines take as directed.  If you are on coumadin or contraceptives realize their levels and effectiveness is altered by many different medicines.  If you have any reaction (rash, tongues swelling, other) to the medicines stop taking and see a physician.   Please follow up as directed and return to the ER or see a physician for new or worsening symptoms.  Thank you. Filed Vitals:   12/27/13 2324 12/28/13 0209  BP: 141/86 149/81  Pulse: 80 77  Temp: 98.1 F (36.7 C)   TempSrc: Oral   Resp: 20   SpO2: 100% 99%    Abdominal Pain, Adult Many things can cause abdominal pain. Usually, abdominal pain is not caused by a disease and will improve without treatment. It can often be observed and treated at home. Your health care provider will do a physical exam and possibly order blood tests and X-rays to help determine the seriousness of your pain. However, in many cases, more time must pass before a clear cause of the pain can be found. Before that point, your health care provider may not know if you need more testing or further treatment. HOME CARE INSTRUCTIONS  Monitor your abdominal pain for any changes. The following actions may help to alleviate any discomfort you are experiencing:  Only take over-the-counter or prescription medicines as directed by your health care provider.  Do not take laxatives unless directed to do so by your health care provider.  Try a clear liquid diet (broth, tea, or water) as directed by your health care provider. Slowly move to a bland diet as tolerated. SEEK MEDICAL CARE IF:  You have unexplained abdominal pain.  You have abdominal pain associated with nausea or diarrhea.  You have pain when you urinate or have a bowel movement.  You experience abdominal pain that wakes you in the night.  You have abdominal pain that is worsened or improved by eating food.  You have abdominal pain that is worsened with eating fatty foods. SEEK IMMEDIATE  MEDICAL CARE IF:   Your pain does not go away within 2 hours.  You have a fever.  You keep throwing up (vomiting).  Your pain is felt only in portions of the abdomen, such as the right side or the left lower portion of the abdomen.  You pass bloody or black tarry stools. MAKE SURE YOU:  Understand these instructions.   Will watch your condition.   Will get help right away if you are not doing well or get worse.  Document Released: 04/17/2005 Document Revised: 04/28/2013 Document Reviewed: 03/17/2013 [facility] Patient Information 2014 [place], [place].

## 2013-12-28 NOTE — ED Provider Notes (Signed)
CSN: [id number]     Arrival date & time 12/27/13  2200 History   First MD Initiated Contact with Patient 12/28/13 0023     Chief Complaint  Patient presents with  . Abdominal Pain    post vomiting     (Consider location/radiation/quality/duration/timing/severity/associated sxs/prior Treatment) HPI Comments: 76 rolled male with history of alcohol abuse, pancreas, high blood pressure and substance abuse presents with central epigastric abdominal pain radiating to the back. History of similar to his pancreatitis and after drinking. Patient drinks alcohol daily last week last night. Intermittent vomiting no blood. No known ulcer history. Patient is a smoker. No fevers or chills. Worse with palpation.  Patient is a 38 y.o. male presenting with abdominal pain. The history is provided by the patient.  Abdominal Pain Associated symptoms: nausea and vomiting   Associated symptoms: no chest pain, no chills, no diarrhea, no dysuria, no fever and no shortness of breath     Past Medical History  Diagnosis Date  . Pancreatitis    Past Surgical History  Procedure Laterality Date  . Ankle fracture surgery     History reviewed. No pertinent family history. History  Substance Use Topics  . Smoking status: Current Every Day Smoker -- 1.00 packs/day  . Smokeless tobacco: Not on file  . Alcohol Use: 7.2 oz/week    12 Cans of beer per week     Comment: daily    Review of Systems  Constitutional: Negative for fever and chills.  HENT: Negative for congestion.   Eyes: Negative for visual disturbance.  Respiratory: Negative for shortness of breath.   Cardiovascular: Negative for chest pain.  Gastrointestinal: Positive for nausea, vomiting and abdominal pain. Negative for diarrhea.  Genitourinary: Negative for dysuria and flank pain.  Musculoskeletal: Negative for back pain, neck pain and neck stiffness.  Skin: Negative for rash.  Neurological: Negative for light-headedness and headaches.       Allergies  Hydromorphone hcl  Home Medications   Prior to Admission medications   Medication Sig Start Date End Date Taking? Authorizing Provider  ibuprofen (ADVIL,MOTRIN) 200 MG tablet Take 400 mg by mouth every 6 (six) hours as needed (pain).   Yes Historical Provider, MD  ondansetron (ZOFRAN) 4 MG tablet Take 1 tablet (4 mg total) by mouth every 6 (six) hours. 12/28/13   Enid Skeens, MD   BP 149/81  Pulse 77  Temp(Src) 98.1 F (36.7 C) (Oral)  Resp 20  SpO2 99% Physical Exam  Nursing note and vitals reviewed. Constitutional: He is oriented to person, place, and time. He appears well-developed and well-nourished.  HENT:  Head: Normocephalic and atraumatic.  Mild dry meters membranes  Eyes: Conjunctivae are normal. Right eye exhibits no discharge. Left eye exhibits no discharge.  Neck: Normal range of motion. Neck supple. No tracheal deviation present.  Cardiovascular: Normal rate and regular rhythm.   Pulmonary/Chest: Effort normal and breath sounds normal.  Abdominal: Soft. He exhibits no distension. Tenderness: central and epig mild. There is no guarding.  Musculoskeletal: He exhibits no edema.  Neurological: He is alert and oriented to person, place, and time.  Skin: Skin is warm. No rash noted.  Psychiatric: He has a normal mood and affect.    ED Course  Procedures (including critical care time) Labs Review Labs Reviewed  COMPREHENSIVE METABOLIC PANEL - Abnormal; Notable for the following:    Sodium 136 (*)    Glucose, Bld 101 (*)    AST 58 (*)    All other  components within normal limits  URINALYSIS, ROUTINE W REFLEX MICROSCOPIC - Abnormal; Notable for the following:    Leukocytes, UA SMALL (*)    All other components within normal limits  URINE MICROSCOPIC-ADD ON - Abnormal; Notable for the following:    Bacteria, UA FEW (*)    All other components within normal limits  LIPASE, BLOOD  CBC WITH DIFFERENTIAL    Imaging Review No results  found.   EKG Interpretation None      MDM   Final diagnoses:  Abdominal pain  Vomiting  Alcohol abuse   Patient presents with similar presentation to multiple previous episodes. Clinical concern for gastritis, ulcer, pancreatitis. Patient improved in ER at IV fluids pain meds and nausea meds. Blood work and lipase unremarkable. Urinalysis unremarkable. Patient tolerated by mouth and will followup outpatient.  Results and differential diagnosis were discussed with the patient/parent/guardian. Close follow up outpatient was discussed, comfortable with the plan.   Medications  morphine 4 MG/ML injection 8 mg (8 mg Intravenous Given 12/28/13 0044)  sodium chloride 0.9 % bolus 1,000 mL (1,000 mLs Intravenous New Bag/Given 12/28/13 0044)  ondansetron (ZOFRAN) injection 4 mg (4 mg Intravenous Given 12/28/13 0044)  ketorolac (TORADOL) 30 MG/ML injection 30 mg (30 mg Intravenous Given 12/28/13 0207)  acetaminophen (TYLENOL) tablet 650 mg (650 mg Oral Given 12/28/13 0206)    Filed Vitals:   12/27/13 2324 12/28/13 0209  BP: 141/86 149/81  Pulse: 80 77  Temp: 98.1 F (36.7 C)   TempSrc: Oral   Resp: 20   SpO2: 100% 99%        Enid SkeensJoshua M Abra Lingenfelter, MD 12/28/13 [phone]

## 2014-02-06 ENCOUNTER — Encounter (HOSPITAL_COMMUNITY): Payer: Self-pay | Admitting: Emergency Medicine

## 2014-02-06 ENCOUNTER — Emergency Department (HOSPITAL_COMMUNITY)
Admission: EM | Admit: 2014-02-06 | Discharge: 2014-02-06 | Disposition: A | Discharge status: Home / Self Care | Payer: Self-pay | Attending: Emergency Medicine | Admitting: Emergency Medicine

## 2014-02-06 DIAGNOSIS — Z8719 Personal history of other diseases of the digestive system: Secondary | ICD-10-CM | POA: Insufficient documentation

## 2014-02-06 DIAGNOSIS — L03317 Cellulitis of buttock: Principal | ICD-10-CM

## 2014-02-06 DIAGNOSIS — F17200 Nicotine dependence, unspecified, uncomplicated: Secondary | ICD-10-CM | POA: Insufficient documentation

## 2014-02-06 DIAGNOSIS — L0231 Cutaneous abscess of buttock: Secondary | ICD-10-CM | POA: Insufficient documentation

## 2014-02-06 DIAGNOSIS — Z79899 Other long term (current) drug therapy: Secondary | ICD-10-CM | POA: Insufficient documentation

## 2014-02-06 MED ORDER — OXYCODONE-ACETAMINOPHEN 5-325 MG PO TABS: 1.0000 | ORAL_TABLET | ORAL | Status: AC | PRN

## 2014-02-06 MED ORDER — IBUPROFEN 200 MG PO TABS
600.0000 mg | ORAL_TABLET | Freq: Once | ORAL | Status: AC
  Administered 2014-02-06: 600 mg via ORAL
  Filled 2014-02-06: qty 3

## 2014-02-06 NOTE — Discharge Instructions (Signed)

## 2014-02-06 NOTE — ED Provider Notes (Signed)
CSN: [id number]     Arrival date & time 02/06/14  2221 History   First MD Initiated Contact with Patient 02/06/14 2235     Chief Complaint  Patient presents with  . Abscess     (Consider location/radiation/quality/duration/timing/severity/associated sxs/prior Treatment) HPI  38 year old male with a painful lesion to his buttock. Triage note was reviewed, but this lesion is on the right side. Gradual onset 2 days ago and progressively worsening. Cannot sit directly on area secondary to pain. No fevers or chills. No drainage. Patient has a past history of alcohol abuse and pancreatitis. He currently denies any abdominal pain. No intervention prior to arrival  Past Medical History  Diagnosis Date  . Pancreatitis    Past Surgical History  Procedure Laterality Date  . Ankle fracture surgery     History reviewed. No pertinent family history. History  Substance Use Topics  . Smoking status: Current Every Day Smoker -- 1.00 packs/day  . Smokeless tobacco: Not on file  . Alcohol Use: 7.2 oz/week    12 Cans of beer per week     Comment: daily    Review of Systems  All systems reviewed and negative, other than as noted in HPI.   Allergies  Hydromorphone hcl  Home Medications   Prior to Admission medications   Medication Sig Start Date End Date Taking? Authorizing Provider  ibuprofen (ADVIL,MOTRIN) 200 MG tablet Take 400 mg by mouth every 6 (six) hours as needed (pain).    Historical Provider, MD  ondansetron (ZOFRAN) 4 MG tablet Take 1 tablet (4 mg total) by mouth every 6 (six) hours. 12/28/13   Enid SkeensJoshua M Zavitz, MD  oxyCODONE-acetaminophen (PERCOCET/ROXICET) 5-325 MG per tablet Take 1 tablet by mouth every 4 (four) hours as needed for severe pain. 02/06/14   Raeford RazorStephen Alexei Ey, MD   BP 133/75  Pulse 101  Temp(Src) 97.9 F (36.6 C) (Oral)  Resp 16  SpO2 96% Physical Exam  Nursing note and vitals reviewed. Constitutional: He appears well-developed and well-nourished. No distress.   HENT:  Head: Normocephalic and atraumatic.  Eyes: Conjunctivae are normal. Right eye exhibits no discharge. Left eye exhibits no discharge.  Neck: Neck supple.  Cardiovascular: Normal rate, regular rhythm and normal heart sounds.  Exam reveals no gallop and no friction rub.   No murmur heard. Pulmonary/Chest: Effort normal and breath sounds normal. No respiratory distress.  Abdominal: Soft. He exhibits no distension. There is no tenderness.  Genitourinary:  Tender, fluctuant lesion to the right buttock. A small amount of surrounding induration. Edge of induration and tenderness ~2cm from anus. No significant pain with digital rectal examination. No fluctuance.  Musculoskeletal: He exhibits no edema and no tenderness.  Neurological: He is alert.  Skin: Skin is warm and dry.  Psychiatric: He has a normal mood and affect. His behavior is normal. Thought content normal.    ED Course  Procedures (including critical care time)  INCISION AND DRAINAGE Performed by: Raeford RazorKOHUT, Jearldean Gutt Consent: Verbal consent obtained. Risks and benefits: risks, benefits and alternatives were discussed Type: abscess  Body area: buttock  Anesthesia: local infiltration  Incision was made with a scalpel.  Local anesthetic: lidocaine 2% w epinephrine  Anesthetic total: 3  ml  Complexity: complex Blunt dissection to break up loculations  Drainage: purulent  Drainage amount: large   Packing material: none  Patient tolerance: Patient tolerated the procedure well with no immediate complications.   Labs Review Labs Reviewed - No data to display  Imaging Review No results found.  EKG Interpretation None      MDM   Final diagnoses:  Abscess of buttock, left    35 six-year-old male with an abscess to his buttock. He does have a history of alcohol abuse, but this abscess appears to be uncomplicated. Was incised and drained at the bedside without apparent complication. Abscess is a couple  centimeters away from the anus and no significant tenderness or fluctuance appreciated on digital rectal examination. No clear indication for antibiotics. Sitz baths. Return precautions were discussed.    Raeford Razor, MD 02/10/14 [phone]

## 2014-02-06 NOTE — ED Notes (Signed)
Patient is alert and oriented x3.  He was given DC instructions and follow up visit instructions.  Patient gave verbal understanding.  He was DC ambulatory under his own power to home.  V/S stable.  He was not showing any signs of distress on DC 

## 2014-02-06 NOTE — ED Notes (Signed)
Pt states he has an abscess on his left buttock

## 2018-10-12 ENCOUNTER — Encounter (HOSPITAL_COMMUNITY): Payer: Self-pay | Admitting: *Deleted

## 2018-10-12 ENCOUNTER — Other Ambulatory Visit: Payer: Self-pay

## 2018-10-12 ENCOUNTER — Inpatient Hospital Stay (HOSPITAL_COMMUNITY)
Admission: EM | Admit: 2018-10-12 | Discharge: 2018-10-15 | DRG: 439 | Disposition: A | Discharge status: Home / Self Care | Payer: Self-pay | Attending: Internal Medicine | Admitting: Internal Medicine

## 2018-10-12 DIAGNOSIS — F17210 Nicotine dependence, cigarettes, uncomplicated: Secondary | ICD-10-CM | POA: Diagnosis present

## 2018-10-12 DIAGNOSIS — I10 Essential (primary) hypertension: Secondary | ICD-10-CM | POA: Diagnosis present

## 2018-10-12 DIAGNOSIS — F1010 Alcohol abuse, uncomplicated: Secondary | ICD-10-CM | POA: Diagnosis present

## 2018-10-12 DIAGNOSIS — K863 Pseudocyst of pancreas: Secondary | ICD-10-CM

## 2018-10-12 DIAGNOSIS — K860 Alcohol-induced chronic pancreatitis: Secondary | ICD-10-CM | POA: Diagnosis present

## 2018-10-12 DIAGNOSIS — Y906 Blood alcohol level of 120-199 mg/100 ml: Secondary | ICD-10-CM | POA: Diagnosis present

## 2018-10-12 DIAGNOSIS — R7401 Elevation of levels of liver transaminase levels: Secondary | ICD-10-CM

## 2018-10-12 DIAGNOSIS — F1411 Cocaine abuse, in remission: Secondary | ICD-10-CM | POA: Diagnosis present

## 2018-10-12 DIAGNOSIS — F10220 Alcohol dependence with intoxication, uncomplicated: Secondary | ICD-10-CM | POA: Diagnosis present

## 2018-10-12 DIAGNOSIS — R740 Nonspecific elevation of levels of transaminase and lactic acid dehydrogenase [LDH]: Secondary | ICD-10-CM

## 2018-10-12 DIAGNOSIS — K852 Alcohol induced acute pancreatitis without necrosis or infection: Principal | ICD-10-CM | POA: Diagnosis present

## 2018-10-12 DIAGNOSIS — K861 Other chronic pancreatitis: Secondary | ICD-10-CM

## 2018-10-12 DIAGNOSIS — K859 Acute pancreatitis without necrosis or infection, unspecified: Secondary | ICD-10-CM | POA: Diagnosis present

## 2018-10-12 DIAGNOSIS — Z885 Allergy status to narcotic agent status: Secondary | ICD-10-CM

## 2018-10-12 DIAGNOSIS — F10920 Alcohol use, unspecified with intoxication, uncomplicated: Secondary | ICD-10-CM

## 2018-10-12 LAB — URINALYSIS, ROUTINE W REFLEX MICROSCOPIC
Bacteria, UA: NONE SEEN
Bilirubin Urine: NEGATIVE
Glucose, UA: NEGATIVE mg/dL
Hgb urine dipstick: NEGATIVE
Ketones, ur: NEGATIVE mg/dL
Nitrite: NEGATIVE
PROTEIN: NEGATIVE mg/dL
Specific Gravity, Urine: 1.005 (ref 1.005–1.030)
pH: 5 (ref 5.0–8.0)

## 2018-10-12 LAB — CBC WITH DIFFERENTIAL/PLATELET
Abs Immature Granulocytes: 0.01 10*3/uL (ref 0.00–0.07)
Basophils Absolute: 0 10*3/uL (ref 0.0–0.1)
Basophils Relative: 1 %
Eosinophils Absolute: 0.1 10*3/uL (ref 0.0–0.5)
Eosinophils Relative: 1 %
HCT: 39.7 % (ref 39.0–52.0)
Hemoglobin: 13.1 g/dL (ref 13.0–17.0)
Immature Granulocytes: 0 %
Lymphocytes Relative: 59 %
Lymphs Abs: 4.2 10*3/uL — ABNORMAL HIGH (ref 0.7–4.0)
MCH: 32.1 pg (ref 26.0–34.0)
MCHC: 33 g/dL (ref 30.0–36.0)
MCV: 97.3 fL (ref 80.0–100.0)
Monocytes Absolute: 0.5 10*3/uL (ref 0.1–1.0)
Monocytes Relative: 6 %
Neutro Abs: 2.4 10*3/uL (ref 1.7–7.7)
Neutrophils Relative %: 33 %
Platelets: 197 10*3/uL (ref 150–400)
RBC: 4.08 MIL/uL — AB (ref 4.22–5.81)
RDW: 13.2 % (ref 11.5–15.5)
WBC: 7.2 10*3/uL (ref 4.0–10.5)
nRBC: 0 % (ref 0.0–0.2)

## 2018-10-12 MED ORDER — DIPHENHYDRAMINE HCL 50 MG/ML IJ SOLN
25.0000 mg | Freq: Once | INTRAMUSCULAR | Status: AC
  Administered 2018-10-12: 25 mg via INTRAVENOUS
  Filled 2018-10-12: qty 1

## 2018-10-12 MED ORDER — SODIUM CHLORIDE 0.9% FLUSH
3.0000 mL | Freq: Once | INTRAVENOUS | Status: AC
  Administered 2018-10-12: 3 mL via INTRAVENOUS

## 2018-10-12 MED ORDER — ONDANSETRON HCL 4 MG/2ML IJ SOLN
4.0000 mg | Freq: Once | INTRAMUSCULAR | Status: AC
  Administered 2018-10-12: 4 mg via INTRAVENOUS
  Filled 2018-10-12: qty 2

## 2018-10-12 MED ORDER — MORPHINE SULFATE (PF) 4 MG/ML IV SOLN
4.0000 mg | Freq: Once | INTRAVENOUS | Status: AC
  Administered 2018-10-13: 4 mg via INTRAVENOUS
  Filled 2018-10-12: qty 1

## 2018-10-12 MED ORDER — SODIUM CHLORIDE 0.9 % IV BOLUS
1000.0000 mL | Freq: Once | INTRAVENOUS | Status: AC
  Administered 2018-10-12: 1000 mL via INTRAVENOUS

## 2018-10-12 MED ORDER — MORPHINE SULFATE (PF) 4 MG/ML IV SOLN
4.0000 mg | Freq: Once | INTRAVENOUS | Status: AC
  Administered 2018-10-12: 4 mg via INTRAVENOUS
  Filled 2018-10-12: qty 1

## 2018-10-12 NOTE — ED Provider Notes (Signed)
[facility]-EMERGENCY DEPT Provider Note   CSN: [id number] Arrival date & time: 10/12/18  2308    History   Chief Complaint Chief Complaint  Patient presents with   Abdominal Pain    HPI [name] [name] is a 43 y.o. male.   The history is provided by the patient.  He has history of alcohol abuse, substance abuse, hypertension, chronic pancreatitis and comes in complaining of upper abdominal pain typical of pancreatitis.  Admits to drinking heavily for the last 3 days, but is very evasive about the amount he has been drinking.  He states last drink was about 3 hours ago.  His pain started about 1 hour ago.  Pain does radiate to the epigastric area, lower chest, and back.  There is associated nausea and vomiting.  He states he is also having a small amount of diarrhea.  He has not tried anything to treat the pain.  Nothing makes it better, nothing makes it worse.  He currently rates pain a 10/10.  Past Medical History:  Diagnosis Date   Pancreatitis     Patient Active Problem List   Diagnosis Date Noted   ALCOHOL ABUSE 03/21/2008   SUBSTANCE ABUSE, MULTIPLE 03/21/2008   HYPERTENSION 03/21/2008   PANCREATITIS, CHRONIC 03/21/2008    Past Surgical History:  Procedure Laterality Date   ANKLE FRACTURE SURGERY          Home Medications    Prior to Admission medications   Medication Sig Start Date End Date Taking? Authorizing Provider  oxyCODONE-acetaminophen (PERCOCET/ROXICET) 5-325 MG per tablet Take 1 tablet by mouth every 4 (four) hours as needed for severe pain. 02/06/14   Raeford Razor, MD    Family History No family history on file.  Social History Social History   Tobacco Use   Smoking status: Current Every Day Smoker    Packs/day: 1.00  Substance Use Topics   Alcohol use: Yes    Alcohol/week: 12.0 standard drinks    Types: 12 Cans of beer per week    Comment: daily   Drug use: No    Comment: former cocaine use      Allergies   Hydromorphone hcl   Review of Systems Review of Systems  All other systems reviewed and are negative.    Physical Exam Updated Vital Signs BP (!) 174/102    Pulse 86    Temp 98.3 F (36.8 C) (Oral)    Resp 20    SpO2 98%   Physical Exam Vitals signs and nursing note reviewed.    43 year old male, appears uncomfortable, but is in no acute distress. Vital signs are significant for elevated blood pressure. Oxygen saturation is 98%, which is normal. Head is normocephalic and atraumatic. PERRLA, EOMI. Oropharynx is clear. Neck is nontender and supple without adenopathy or JVD. Back is nontender and there is no CVA tenderness. Lungs are clear without rales, wheezes, or rhonchi. Chest is nontender. Heart has regular rate and rhythm without murmur. Abdomen is soft, flat, with moderate tenderness in the epigastric area, periumbilical area, right upper quadrant.  There is no rebound or guarding.  There are no masses or hepatosplenomegaly and peristalsis is hypoactive. Extremities have no cyanosis or edema, full range of motion is present. Skin is warm and dry without rash. Neurologic: Mental status is normal, cranial nerves are intact, there are no motor or sensory deficits.  ED Treatments / Results  Labs (all labs ordered are listed, but only abnormal results are displayed) Labs  Reviewed  COMPREHENSIVE METABOLIC PANEL - Abnormal; Notable for the following components:      Result Value   Glucose, Bld 101 (*)    Calcium 8.7 (*)    AST 69 (*)    ALT 64 (*)    All other components within normal limits  URINALYSIS, ROUTINE W REFLEX MICROSCOPIC - Abnormal; Notable for the following components:   Color, Urine STRAW (*)    Leukocytes,Ua TRACE (*)    All other components within normal limits  CBC WITH DIFFERENTIAL/PLATELET - Abnormal; Notable for the following components:   RBC 4.08 (*)    Lymphs Abs 4.2 (*)    All other components within normal limits  ETHANOL -  Abnormal; Notable for the following components:   Alcohol, Ethyl (B) 181 (*)    All other components within normal limits  LIPASE, BLOOD    Radiology Ct Abdomen Pelvis W Contrast  Result Date: 10/13/2018 CLINICAL DATA:  Abdominal pain with nausea and vomiting. History of chronic pancreatitis EXAM: CT ABDOMEN AND PELVIS WITH CONTRAST TECHNIQUE: Multidetector CT imaging of the abdomen and pelvis was performed using the standard protocol following bolus administration of intravenous contrast. CONTRAST:  100mL OMNIPAQUE IOHEXOL 300 MG/ML  SOLN COMPARISON:  05/01/2008 FINDINGS: Lower chest:  No contributory findings. Hepatobiliary: Tiny low-density in the right liver, likely cyst.No evidence of cholelithiasis. Mild intrahepatic bile duct dilatation likely related to mass effect on the CBD at the pancreatic head. Pancreas: Chronic calcified pancreatitis with generalized coarse calcification and main duct intermittent dilatation. There is a partially high-density mass in the pancreatic head and uncinate process measuring up to 5 cm. This mass has hazy high internal density likely reflects a hemorrhagic pseudocyst. No surrounding inflammation to indicate acute pancreatitis Spleen: Unremarkable. Adrenals/Urinary Tract: Negative adrenals. No hydronephrosis or stone. Unremarkable bladder. Stomach/Bowel: No obstruction. No evidence of bowel inflammation, including appendicitis Vascular/Lymphatic: No acute vascular abnormality. Extensive and age advanced atherosclerosis with high-grade left common iliac stenosis on the left no mass or adenopathy. Reproductive:Negative. Other: No ascites or pneumoperitoneum. Musculoskeletal: No acute abnormalities. IMPRESSION: 1. Chronic pancreatitis. A 5 cm pancreatic head/uncinate mass that is likely a pseudocyst. Internal high-density suggest hemorrhagic contents. 2. Mild bile duct dilatation above the pancreatic mass. 3. Advanced atherosclerosis, significantly age advanced.  Electronically Signed   By: Marnee SpringJonathon  Watts M.D.   On: 10/13/2018 04:11    Procedures Procedures   Medications Ordered in ED Medications  sodium chloride (PF) 0.9 % injection (has no administration in time range)  HYDROmorphone (DILAUDID) injection 1 mg (has no administration in time range)  sodium chloride flush (NS) 0.9 % injection 3 mL (3 mLs Intravenous Given 10/12/18 2340)  sodium chloride 0.9 % bolus 1,000 mL (0 mLs Intravenous Stopped 10/13/18 0110)  ondansetron (ZOFRAN) injection 4 mg (4 mg Intravenous Given 10/12/18 2339)  morphine 4 MG/ML injection 4 mg (4 mg Intravenous Given 10/12/18 2340)  diphenhydrAMINE (BENADRYL) injection 25 mg (25 mg Intravenous Given 10/12/18 2340)  morphine 4 MG/ML injection 4 mg (4 mg Intravenous Given 10/12/18 2347)  morphine 4 MG/ML injection 4 mg (4 mg Intravenous Given 10/13/18 0027)  HYDROmorphone (DILAUDID) injection 0.5 mg (0.5 mg Intravenous Given 10/13/18 0035)  alum & mag hydroxide-simeth (MAALOX/MYLANTA) 200-200-20 MG/5ML suspension 30 mL (30 mLs Oral Given 10/13/18 0035)    And  lidocaine (XYLOCAINE) 2 % viscous mouth solution 15 mL (15 mLs Oral Given 10/13/18 0035)  pantoprazole (PROTONIX) EC tablet 40 mg (40 mg Oral Given 10/13/18 0037)  HYDROmorphone (DILAUDID) injection  0.5 mg (0.5 mg Intravenous Given 10/13/18 0109)  oxyCODONE (Oxy IR/ROXICODONE) immediate release tablet 5 mg (5 mg Oral Given 10/13/18 0327)  iohexol (OMNIPAQUE) 300 MG/ML solution 100 mL (100 mLs Intravenous Contrast Given 10/13/18 0346)     Initial Impression / Assessment and Plan / ED Course  I have reviewed the triage vital signs and the nursing notes.  Pertinent labs & imaging results that were available during my care of the patient were reviewed by me and considered in my medical decision making (see chart for details).  Abdominal pain in the setting of alcohol abuse.  Likely recurrent alcoholic pancreatitis, possible alcoholic gastritis.  Consider peptic ulcer disease.   Biliary colic felt to be much less likely.  Old records are reviewed, and he did have hospitalizations for pancreatitis, but most recent one was in 2011.  He will be given IV fluids, morphine, ondansetron.  He had no pain relief with 12 mg of morphine.  He is given a total of 1 mg hydromorphone and still is complaining of pain.  He did get some temporary relief of pain with a GI cocktail.  He is given a dose of pantoprazole as well.  Labs show alcohol intoxication with ethanol level 181.  Remainder of labs were normal including normal lipase. and minimal elevation of transaminases.  On reviewing old records, he never had an extremely high lipase.  However, given ongoing pain, will send for CT of abdomen and pelvis to make sure there is no other significant pathology besides alcoholic gastritis.  CT scan shows calcifications of chronic pancreatitis.  Also, there is a 5 cm pseudocyst with hemorrhagic contents.  This is likely the cause of his severe pain.  He continues to get an adequate pain relief.  He is given additional hydromorphone and will need to be admitted for pain control.  Case is discussed with Dr. Antionette Char of [facility] hospital[facility], who agrees to admit the patient.  Final Clinical Impressions(s) / ED Diagnoses   Final diagnoses:  Acute on chronic pancreatitis [facility])  Pancreatic pseudocyst  Elevated transaminase level  Alcohol intoxication, uncomplicated [facility])    ED Discharge Orders    None       Dione Booze, MD 10/13/18 [phone]

## 2018-10-12 NOTE — ED Notes (Signed)
Pt aware of urine need 

## 2018-10-12 NOTE — ED Triage Notes (Signed)
Pt reports he feels like he has pancreatitis. Onset of abdominal pain, nausea and vomiting today.

## 2018-10-13 ENCOUNTER — Encounter (HOSPITAL_COMMUNITY): Payer: Self-pay

## 2018-10-13 ENCOUNTER — Emergency Department (HOSPITAL_COMMUNITY): Payer: Self-pay

## 2018-10-13 DIAGNOSIS — K861 Other chronic pancreatitis: Secondary | ICD-10-CM | POA: Diagnosis present

## 2018-10-13 DIAGNOSIS — K859 Acute pancreatitis without necrosis or infection, unspecified: Secondary | ICD-10-CM | POA: Diagnosis present

## 2018-10-13 DIAGNOSIS — F1010 Alcohol abuse, uncomplicated: Secondary | ICD-10-CM

## 2018-10-13 DIAGNOSIS — K863 Pseudocyst of pancreas: Secondary | ICD-10-CM

## 2018-10-13 LAB — ETHANOL: Alcohol, Ethyl (B): 181 mg/dL — ABNORMAL HIGH (ref <10)

## 2018-10-13 LAB — COMPREHENSIVE METABOLIC PANEL
ALK PHOS: 71 U/L (ref 38–126)
ALT: 64 U/L — AB (ref 0–44)
ANION GAP: 7 (ref 5–15)
AST: 69 U/L — ABNORMAL HIGH (ref 15–41)
Albumin: 4 g/dL (ref 3.5–5.0)
BUN: 11 mg/dL (ref 6–20)
CALCIUM: 8.7 mg/dL — AB (ref 8.9–10.3)
CO2: 24 mmol/L (ref 22–32)
Chloride: 104 mmol/L (ref 98–111)
Creatinine, Ser: 0.88 mg/dL (ref 0.61–1.24)
GFR calc non Af Amer: >60 mL/min (ref >60)
Glucose, Bld: 101 mg/dL — ABNORMAL HIGH (ref 70–99)
Potassium: 4.2 mmol/L (ref 3.5–5.1)
Sodium: 135 mmol/L (ref 135–145)
Total Bilirubin: 0.5 mg/dL (ref 0.3–1.2)
Total Protein: 7.5 g/dL (ref 6.5–8.1)

## 2018-10-13 LAB — LACTATE DEHYDROGENASE: LDH: 160 U/L (ref 98–192)

## 2018-10-13 LAB — GLUCOSE, CAPILLARY: Glucose-Capillary: 104 mg/dL — ABNORMAL HIGH (ref 70–99)

## 2018-10-13 LAB — LIPASE, BLOOD: Lipase: 25 U/L (ref 11–51)

## 2018-10-13 MED ORDER — LORAZEPAM 1 MG PO TABS
1.0000 mg | ORAL_TABLET | Freq: Four times a day (QID) | ORAL | Status: DC | PRN
  Administered 2018-10-15: 1 mg via ORAL
  Filled 2018-10-13: qty 1

## 2018-10-13 MED ORDER — VITAMIN B-1 100 MG PO TABS
100.0000 mg | ORAL_TABLET | Freq: Every day | ORAL | Status: DC
  Administered 2018-10-13 – 2018-10-15 (×3): 100 mg via ORAL
  Filled 2018-10-13 (×3): qty 1

## 2018-10-13 MED ORDER — HYDROMORPHONE HCL 1 MG/ML IJ SOLN
0.5000 mg | INTRAMUSCULAR | Status: DC | PRN
  Administered 2018-10-13 – 2018-10-15 (×11): 1 mg via INTRAVENOUS
  Filled 2018-10-13 (×11): qty 1

## 2018-10-13 MED ORDER — HYDROMORPHONE HCL 1 MG/ML IJ SOLN
0.5000 mg | Freq: Once | INTRAMUSCULAR | Status: AC
  Administered 2018-10-13: 0.5 mg via INTRAVENOUS
  Filled 2018-10-13: qty 1

## 2018-10-13 MED ORDER — ACETAMINOPHEN 325 MG PO TABS: 650.0000 mg | ORAL_TABLET | Freq: Four times a day (QID) | ORAL | Status: DC | PRN

## 2018-10-13 MED ORDER — ONDANSETRON HCL 4 MG/2ML IJ SOLN
4.0000 mg | Freq: Four times a day (QID) | INTRAMUSCULAR | Status: DC | PRN
  Administered 2018-10-13: 4 mg via INTRAVENOUS
  Filled 2018-10-13: qty 2

## 2018-10-13 MED ORDER — LORAZEPAM 2 MG/ML IJ SOLN: 0.0000 mg | Freq: Two times a day (BID) | INTRAMUSCULAR | Status: DC

## 2018-10-13 MED ORDER — SODIUM CHLORIDE (PF) 0.9 % IJ SOLN
INTRAMUSCULAR | Status: AC
  Filled 2018-10-13: qty 50

## 2018-10-13 MED ORDER — LORAZEPAM 2 MG/ML IJ SOLN
0.0000 mg | Freq: Four times a day (QID) | INTRAMUSCULAR | Status: AC
  Administered 2018-10-13: 1 mg via INTRAVENOUS
  Administered 2018-10-13 – 2018-10-14 (×2): 2 mg via INTRAVENOUS
  Filled 2018-10-13 (×3): qty 1

## 2018-10-13 MED ORDER — HYDROMORPHONE HCL 1 MG/ML IJ SOLN
1.0000 mg | Freq: Once | INTRAMUSCULAR | Status: AC
  Administered 2018-10-13: 1 mg via INTRAVENOUS
  Filled 2018-10-13: qty 1

## 2018-10-13 MED ORDER — SODIUM CHLORIDE 0.9 % IV SOLN
INTRAVENOUS | Status: AC
  Administered 2018-10-13 (×3): via INTRAVENOUS

## 2018-10-13 MED ORDER — ONDANSETRON HCL 4 MG PO TABS: 4.0000 mg | ORAL_TABLET | Freq: Four times a day (QID) | ORAL | Status: DC | PRN

## 2018-10-13 MED ORDER — ADULT MULTIVITAMIN W/MINERALS CH
1.0000 | ORAL_TABLET | Freq: Every day | ORAL | Status: DC
  Administered 2018-10-13 – 2018-10-15 (×3): 1 via ORAL
  Filled 2018-10-13 (×3): qty 1

## 2018-10-13 MED ORDER — LORAZEPAM 2 MG/ML IJ SOLN
1.0000 mg | Freq: Four times a day (QID) | INTRAMUSCULAR | Status: DC | PRN
  Filled 2018-10-13: qty 1

## 2018-10-13 MED ORDER — IOHEXOL 300 MG/ML  SOLN
100.0000 mL | Freq: Once | INTRAMUSCULAR | Status: AC | PRN
  Administered 2018-10-13: 100 mL via INTRAVENOUS

## 2018-10-13 MED ORDER — LIDOCAINE VISCOUS HCL 2 % MT SOLN
15.0000 mL | Freq: Once | OROMUCOSAL | Status: AC
  Administered 2018-10-13: 15 mL via ORAL
  Filled 2018-10-13: qty 15

## 2018-10-13 MED ORDER — ACETAMINOPHEN 650 MG RE SUPP: 650.0000 mg | Freq: Four times a day (QID) | RECTAL | Status: DC | PRN

## 2018-10-13 MED ORDER — PANTOPRAZOLE SODIUM 40 MG PO TBEC
40.0000 mg | DELAYED_RELEASE_TABLET | Freq: Once | ORAL | Status: AC
  Administered 2018-10-13: 40 mg via ORAL
  Filled 2018-10-13: qty 1

## 2018-10-13 MED ORDER — THIAMINE HCL 100 MG/ML IJ SOLN: 100.0000 mg | Freq: Every day | INTRAMUSCULAR | Status: DC

## 2018-10-13 MED ORDER — ALUM & MAG HYDROXIDE-SIMETH 200-200-20 MG/5ML PO SUSP
30.0000 mL | Freq: Once | ORAL | Status: AC
  Administered 2018-10-13: 30 mL via ORAL
  Filled 2018-10-13: qty 30

## 2018-10-13 MED ORDER — FOLIC ACID 1 MG PO TABS
1.0000 mg | ORAL_TABLET | Freq: Every day | ORAL | Status: DC
  Administered 2018-10-13 – 2018-10-15 (×3): 1 mg via ORAL
  Filled 2018-10-13 (×3): qty 1

## 2018-10-13 MED ORDER — OXYCODONE HCL 5 MG PO TABS
5.0000 mg | ORAL_TABLET | Freq: Once | ORAL | Status: AC
  Administered 2018-10-13: 5 mg via ORAL
  Filled 2018-10-13: qty 1

## 2018-10-13 NOTE — ED Notes (Signed)
ED TO INPATIENT HANDOFF REPORT  ED Nurse Name and Phone #: jon [email or web address]   S Name/Age/Gender Tenna Delaine 43 y.o. male Room/Bed: WA13/WA13  Code Status   Code Status: Full Code  Home/SNF/Other Home {Patient oriented  Is this baseline? yes  Triage Complete: Triage complete  Chief Complaint ABDOMINAL PAIN   Triage Note Pt reports he feels like he has pancreatitis. Onset of abdominal pain, nausea and vomiting today.    Allergies Allergies  Allergen Reactions  . Hydromorphone Hcl     REACTION: itching    Level of Care/Admitting Diagnosis ED Disposition    ED Disposition Condition Comment   Admit  Hospital Area: [facility] [[id number]]  Level of Care: Med-Surg [16]  Diagnosis: Acute on chronic pancreatitis [facility]) [[id number]]  Admitting Physician: Briscoe Deutscher [[id number]]  Attending Physician: Briscoe Deutscher [[id number]]  PT Class (Do Not Modify): Observation [104]  PT Acc Code (Do Not Modify): Observation [10022]       B Medical/Surgery History Past Medical History:  Diagnosis Date  . Pancreatitis    Past Surgical History:  Procedure Laterality Date  . ANKLE FRACTURE SURGERY       A IV Location/Drains/Wounds Patient Lines/Drains/Airways Status   Active Line/Drains/Airways    Name:   Placement date:   Placement time:   Site:   Days:   Peripheral IV 10/12/18 Left Antecubital   10/12/18    2338    Antecubital   1          Intake/Output Last 24 hours  Intake/Output Summary (Last 24 hours) at 10/13/2018 0553 Last data filed at 10/13/2018 0110 Gross per 24 hour  Intake 1003 ml  Output -  Net 1003 ml    Labs/Imaging Results for orders placed or performed during the hospital encounter of 10/12/18 (from the past 48 hour(s))  Lipase, blood     Status: None   Collection Time: 10/12/18 11:17 PM  Result Value Ref Range   Lipase 25 11 - 51 U/L    Comment: Performed at [facility], [address] [place], [place] [address]   Comprehensive metabolic panel     Status: Abnormal   Collection Time: 10/12/18 11:17 PM  Result Value Ref Range   Sodium 135 135 - 145 mmol/L   Potassium 4.2 3.5 - 5.1 mmol/L   Chloride 104 98 - 111 mmol/L   CO2 24 22 - 32 mmol/L   Glucose, Bld 101 (H) 70 - 99 mg/dL   BUN 11 6 - 20 mg/dL   Creatinine, Ser 3.08 0.61 - 1.24 mg/dL   Calcium 8.7 (L) 8.9 - 10.3 mg/dL   Total Protein 7.5 6.5 - 8.1 g/dL   Albumin 4.0 3.5 - 5.0 g/dL   AST 69 (H) 15 - 41 U/L   ALT 64 (H) 0 - 44 U/L   Alkaline Phosphatase 71 38 - 126 U/L   Total Bilirubin 0.5 0.3 - 1.2 mg/dL   GFR calc non Af Amer >60 >60 mL/min   GFR calc Af Amer >60 >60 mL/min   Anion gap 7 5 - 15    Comment: Performed at [facility], [address] [place], [place] [address]  CBC with Differential     Status: Abnormal   Collection Time: 10/12/18 11:18 PM  Result Value Ref Range   WBC 7.2 4.0 - 10.5 K/uL   RBC 4.08 (L) 4.22 - 5.81 MIL/uL   Hemoglobin 13.1 13.0 - 17.0 g/dL   HCT  39.7 39.0 - 52.0 %   MCV 97.3 80.0 - 100.0 fL   MCH 32.1 26.0 - 34.0 pg   MCHC 33.0 30.0 - 36.0 g/dL   RDW 95.6 21.3 - 08.6 %   Platelets 197 150 - 400 K/uL   nRBC 0.0 0.0 - 0.2 %   Neutrophils Relative % 33 %   Neutro Abs 2.4 1.7 - 7.7 K/uL   Lymphocytes Relative 59 %   Lymphs Abs 4.2 (H) 0.7 - 4.0 K/uL   Monocytes Relative 6 %   Monocytes Absolute 0.5 0.1 - 1.0 K/uL   Eosinophils Relative 1 %   Eosinophils Absolute 0.1 0.0 - 0.5 K/uL   Basophils Relative 1 %   Basophils Absolute 0.0 0.0 - 0.1 K/uL   Immature Granulocytes 0 %   Abs Immature Granulocytes 0.01 0.00 - 0.07 K/uL    Comment: Performed at [facility], [address] [place], [place] [address]  Ethanol     Status: Abnormal   Collection Time: 10/12/18 11:18 PM  Result Value Ref Range   Alcohol, Ethyl (B) 181 (H) <10 mg/dL    Comment: (NOTE) Lowest detectable limit for serum alcohol is 10 mg/dL. For medical purposes only. Performed at [facility], [address] [place], [place] [address]   Urinalysis, Routine w reflex microscopic     Status: Abnormal   Collection Time: 10/12/18 11:25 PM  Result Value Ref Range   Color, Urine STRAW (A) YELLOW   APPearance CLEAR CLEAR   Specific Gravity, Urine 1.005 1.005 - 1.030   pH 5.0 5.0 - 8.0   Glucose, UA NEGATIVE NEGATIVE mg/dL   Hgb urine dipstick NEGATIVE NEGATIVE   Bilirubin Urine NEGATIVE NEGATIVE   Ketones, ur NEGATIVE NEGATIVE mg/dL   Protein, ur NEGATIVE NEGATIVE mg/dL   Nitrite NEGATIVE NEGATIVE   Leukocytes,Ua TRACE (A) NEGATIVE   RBC / HPF 0-5 0 - 5 RBC/hpf   WBC, UA 0-5 0 - 5 WBC/hpf   Bacteria, UA NONE SEEN NONE SEEN   Squamous Epithelial / LPF 0-5 0 - 5    Comment: Performed at [facility], [address], [place], [place] [address]   Ct Abdomen Pelvis W Contrast  Result Date: 10/13/2018 CLINICAL DATA:  Abdominal pain with nausea and vomiting. History of chronic pancreatitis EXAM: CT ABDOMEN AND PELVIS WITH CONTRAST TECHNIQUE: Multidetector CT imaging of the abdomen and pelvis was performed using the standard protocol following bolus administration of intravenous contrast. CONTRAST:  OMNIPAQUE IOHEXOL 300 MG/ML  SOLN COMPARISON:  05/01/2008 FINDINGS: Lower chest:  No contributory findings. Hepatobiliary: Tiny low-density in the right liver, likely cyst.No evidence of cholelithiasis. Mild intrahepatic bile duct dilatation likely related to mass effect on the CBD at the pancreatic head. Pancreas: Chronic calcified pancreatitis with generalized coarse calcification and main duct intermittent dilatation. There is a partially high-density mass in the pancreatic head and uncinate process measuring up to 5 cm. This mass has hazy high internal density likely reflects a hemorrhagic pseudocyst. No surrounding inflammation to indicate acute pancreatitis Spleen: Unremarkable. Adrenals/Urinary Tract: Negative adrenals. No hydronephrosis or  stone. Unremarkable bladder. Stomach/Bowel: No obstruction. No evidence of bowel inflammation, including appendicitis Vascular/Lymphatic: No acute vascular abnormality. Extensive and age advanced atherosclerosis with high-grade left common iliac stenosis on the left no mass or adenopathy. Reproductive:Negative. Other: No ascites or pneumoperitoneum. Musculoskeletal: No acute abnormalities. IMPRESSION: 1. Chronic pancreatitis. A 5 cm pancreatic head/uncinate mass that is likely a pseudocyst. Internal high-density suggest  hemorrhagic contents. 2. Mild bile duct dilatation above the pancreatic mass. 3. Advanced atherosclerosis, significantly age advanced. Electronically Signed   By: Marnee Spring M.D.   On: 10/13/2018 04:11    Pending Labs Unresulted Labs (From admission, onward)    Start     Ordered   10/14/18 0500  HIV antibody (Routine Testing)  Tomorrow morning,   R     10/13/18 0513   10/14/18 0500  Comprehensive metabolic panel  Tomorrow morning,   R     10/13/18 0513   10/14/18 0500  CBC WITH DIFFERENTIAL  Tomorrow morning,   R     10/13/18 0513   10/14/18 0500  Lipase, blood  Tomorrow morning,   R     10/13/18 0513          Vitals/Pain Today's Vitals   10/13/18 0110 10/13/18 0200 10/13/18 0241 10/13/18 0259  BP:  (!) 153/88    Pulse:  72    Resp:      Temp:      TempSrc:      SpO2:  96%    PainSc: 10-Worst pain ever  Asleep 8     Isolation Precautions No active isolations  Medications Medications  sodium chloride (PF) 0.9 % injection (has no administration in time range)  HYDROmorphone (DILAUDID) injection 1 mg (has no administration in time range)  0.9 %  sodium chloride infusion (has no administration in time range)  acetaminophen (TYLENOL) tablet 650 mg (has no administration in time range)    Or  acetaminophen (TYLENOL) suppository 650 mg (has no administration in time range)  ondansetron (ZOFRAN) tablet 4 mg (has no administration in time range)    Or   ondansetron (ZOFRAN) injection 4 mg (has no administration in time range)  HYDROmorphone (DILAUDID) injection 0.5-1 mg (has no administration in time range)  LORazepam (ATIVAN) tablet 1 mg (has no administration in time range)    Or  LORazepam (ATIVAN) injection 1 mg (has no administration in time range)  thiamine (VITAMIN B-1) tablet 100 mg (has no administration in time range)    Or  thiamine (B-1) injection 100 mg (has no administration in time range)  folic acid (FOLVITE) tablet 1 mg (has no administration in time range)  multivitamin with minerals tablet 1 tablet (has no administration in time range)  LORazepam (ATIVAN) injection 0-4 mg (has no administration in time range)    Followed by  LORazepam (ATIVAN) injection 0-4 mg (has no administration in time range)  sodium chloride flush (NS) 0.9 % injection 3 mL (3 mLs Intravenous Given 10/12/18 2340)  sodium chloride 0.9 % bolus 1,000 mL (0 mLs Intravenous Stopped 10/13/18 0110)  ondansetron (ZOFRAN) injection 4 mg (4 mg Intravenous Given 10/12/18 2339)  morphine 4 MG/ML injection 4 mg (4 mg Intravenous Given 10/12/18 2340)  diphenhydrAMINE (BENADRYL) injection 25 mg (25 mg Intravenous Given 10/12/18 2340)  morphine 4 MG/ML injection 4 mg (4 mg Intravenous Given 10/12/18 2347)  morphine 4 MG/ML injection 4 mg (4 mg Intravenous Given 10/13/18 0027)  HYDROmorphone (DILAUDID) injection 0.5 mg (0.5 mg Intravenous Given 10/13/18 0035)  alum & mag hydroxide-simeth (MAALOX/MYLANTA) 200-200-20 MG/5ML suspension 30 mL (30 mLs Oral Given 10/13/18 0035)    And  lidocaine (XYLOCAINE) 2 % viscous mouth solution 15 mL (15 mLs Oral Given 10/13/18 0035)  pantoprazole (PROTONIX) EC tablet 40 mg (40 mg Oral Given 10/13/18 0037)  HYDROmorphone (DILAUDID) injection 0.5 mg (0.5 mg Intravenous Given 10/13/18 0109)  oxyCODONE (Oxy IR/ROXICODONE) immediate  release tablet 5 mg (5 mg Oral Given 10/13/18 0327)  iohexol (OMNIPAQUE) 300 MG/ML solution 100 mL (100 mLs  Intravenous Contrast Given 10/13/18 0346)    Mobility walks Low fall risk   Focused Assessments    R Recommendations: See Admitting Provider Note  Report given to:   Additional Notes:

## 2018-10-13 NOTE — H&P (Signed)
History and Physical    [name] UGQ:[id number] DOB: [birth year] DOA: 10/12/2018  PCP: Patient, No Pcp Per   Patient coming from: Home   Chief Complaint: Upper abdominal pain, N/V   HPI: [name] [name] is a 43 y.o. male with medical history significant for alcohol abuse and chronic pancreatitis, now presenting to the emergency department for evaluation of severe upper abdominal pain and nausea.  Patient reports that he had stopped drinking alcohol for several years and had not experienced any episodes of pancreatitis during that interval, but unfortunately returned to excessive alcohol use recently with a binge over the past couple days, and then development of severe upper abdominal pain last night.  Patient reports fairly acute onset of severe, sharp, nonradiating pain in the upper abdomen approximately 8 hours ago.  This has been associated with nausea and recurrent episodes of nonbloody emesis.  He denies any fevers or chills.  ED Course: Upon arrival to the ED, patient is found to be afebrile, saturating well on room air, and with vitals otherwise stable.  Chemistry panel is notable for slight elevation in transaminases and CBC is abnormal Elvis is unremarkable.  CT of the abdomen and pelvis findings are consistent with chronic pancreatitis with a 5 cm pancreatic head/uncinate 8 mass that is likely a pseudocyst with hemorrhagic contents.  Patient was given a liter of normal saline and multiple doses of IV analgesics, but continues to have severe pain and will be observed for ongoing evaluation and management.  Review of Systems:  All other systems reviewed and apart from HPI, are negative.  Past Medical History:  Diagnosis Date   Pancreatitis     Past Surgical History:  Procedure Laterality Date   ANKLE FRACTURE SURGERY       reports that he has been smoking. He has been smoking about 1.00 pack per day. He does not have any smokeless tobacco history on file. He reports current  alcohol use of about 12.0 standard drinks of alcohol per week. He reports that he does not use drugs.  Allergies  Allergen Reactions   Hydromorphone Hcl     REACTION: itching    History reviewed. No pertinent family history.   Prior to Admission medications   Medication Sig Start Date End Date Taking? Authorizing Provider  hydroxypropyl methylcellulose / hypromellose (ISOPTO TEARS / GONIOVISC) 2.5 % ophthalmic solution Place 1 drop into both eyes 3 (three) times daily as needed for dry eyes.   Yes [provider]  ibuprofen (ADVIL,MOTRIN) 200 MG tablet Take 400 mg by mouth every 6 (six) hours as needed for moderate pain.   Yes [provider]  oxyCODONE-acetaminophen (PERCOCET/ROXICET) 5-325 MG per tablet Take 1 tablet by mouth every 4 (four) hours as needed for severe pain. Patient not taking: Reported on 10/13/2018 02/06/14   Raeford Razor, MD    Physical Exam: Vitals:   10/12/18 2315 10/12/18 2348 10/13/18 0200  BP: (!) 174/102 (!) 157/98 (!) 153/88  Pulse: 86 79 72  Resp: 20    Temp: 98.3 F (36.8 C)    TempSrc: Oral    SpO2: 98% 100% 96%    Constitutional: NAD, appears uncomfortable  Eyes: PERTLA, lids and conjunctivae normal ENMT: Mucous membranes are moist. Posterior pharynx clear of any exudate or lesions.   Neck: normal, supple, no masses, no thyromegaly Respiratory: clear to auscultation bilaterally, no wheezing, no crackles. Normal respiratory effort.    Cardiovascular: S1 & S2 heard, regular rate and rhythm. No extremity edema. Abdomen: No  distension, soft, tender in epigastrium. Bowel sounds active.  Musculoskeletal: no clubbing / cyanosis. No joint deformity upper and lower extremities.   Skin: no significant rashes, lesions, ulcers. Warm, dry, well-perfused. Neurologic: CN 2-12 grossly intact. Sensation intact. Strength 5/5 in all 4 limbs.  Psychiatric: Alert and oriented x 3. Calm, cooperative.    Labs on Admission: I have personally  reviewed following labs and imaging studies  CBC: Recent Labs  Lab 10/12/18 2318  WBC 7.2  NEUTROABS 2.4  HGB 13.1  HCT 39.7  MCV 97.3  PLT 197   Basic Metabolic Panel: Recent Labs  Lab 10/12/18 2317  NA 135  K 4.2  CL 104  CO2 24  GLUCOSE 101*  BUN 11  CREATININE 0.88  CALCIUM 8.7*   GFR: CrCl cannot be calculated (Unknown ideal weight.). Liver Function Tests: Recent Labs  Lab 10/12/18 2317  AST 69*  ALT 64*  ALKPHOS 71  BILITOT 0.5  PROT 7.5  ALBUMIN 4.0   Recent Labs  Lab 10/12/18 2317  LIPASE 25   No results for input(s): AMMONIA in the last 168 hours. Coagulation Profile: No results for input(s): INR, PROTIME in the last 168 hours. Cardiac Enzymes: No results for input(s): CKTOTAL, CKMB, CKMBINDEX, TROPONINI in the last 168 hours. BNP (last 3 results) No results for input(s): PROBNP in the last 8760 hours. HbA1C: No results for input(s): HGBA1C in the last 72 hours. CBG: No results for input(s): GLUCAP in the last 168 hours. Lipid Profile: No results for input(s): CHOL, HDL, LDLCALC, TRIG, CHOLHDL, LDLDIRECT in the last 72 hours. Thyroid Function Tests: No results for input(s): TSH, T4TOTAL, FREET4, T3FREE, THYROIDAB in the last 72 hours. Anemia Panel: No results for input(s): VITAMINB12, FOLATE, FERRITIN, TIBC, IRON, RETICCTPCT in the last 72 hours. Urine analysis:    Component Value Date/Time   COLORURINE STRAW (A) 10/12/2018 2325   APPEARANCEUR CLEAR 10/12/2018 2325   LABSPEC 1.005 10/12/2018 2325   PHURINE 5.0 10/12/2018 2325   GLUCOSEU NEGATIVE 10/12/2018 2325   HGBUR NEGATIVE 10/12/2018 2325   BILIRUBINUR NEGATIVE 10/12/2018 2325   KETONESUR NEGATIVE 10/12/2018 2325   PROTEINUR NEGATIVE 10/12/2018 2325   UROBILINOGEN 0.2 12/28/2013 0209   NITRITE NEGATIVE 10/12/2018 2325   LEUKOCYTESUR TRACE (A) 10/12/2018 2325   Sepsis Labs: @LABRCNTIP (procalcitonin:4,lacticidven:4) )No results found for this or any previous visit (from the  past 240 hour(s)).   Radiological Exams on Admission: Ct Abdomen Pelvis W Contrast  Result Date: 10/13/2018 CLINICAL DATA:  Abdominal pain with nausea and vomiting. History of chronic pancreatitis EXAM: CT ABDOMEN AND PELVIS WITH CONTRAST TECHNIQUE: Multidetector CT imaging of the abdomen and pelvis was performed using the standard protocol following bolus administration of intravenous contrast. CONTRAST:  OMNIPAQUE IOHEXOL 300 MG/ML  SOLN COMPARISON:  05/01/2008 FINDINGS: Lower chest:  No contributory findings. Hepatobiliary: Tiny low-density in the right liver, likely cyst.No evidence of cholelithiasis. Mild intrahepatic bile duct dilatation likely related to mass effect on the CBD at the pancreatic head. Pancreas: Chronic calcified pancreatitis with generalized coarse calcification and main duct intermittent dilatation. There is a partially high-density mass in the pancreatic head and uncinate process measuring up to 5 cm. This mass has hazy high internal density likely reflects a hemorrhagic pseudocyst. No surrounding inflammation to indicate acute pancreatitis Spleen: Unremarkable. Adrenals/Urinary Tract: Negative adrenals. No hydronephrosis or stone. Unremarkable bladder. Stomach/Bowel: No obstruction. No evidence of bowel inflammation, including appendicitis Vascular/Lymphatic: No acute vascular abnormality. Extensive and age advanced atherosclerosis with high-grade left common iliac stenosis  on the left no mass or adenopathy. Reproductive:Negative. Other: No ascites or pneumoperitoneum. Musculoskeletal: No acute abnormalities. IMPRESSION: 1. Chronic pancreatitis. A 5 cm pancreatic head/uncinate mass that is likely a pseudocyst. Internal high-density suggest hemorrhagic contents. 2. Mild bile duct dilatation above the pancreatic mass. 3. Advanced atherosclerosis, significantly age advanced. Electronically Signed   By: Marnee Spring M.D.   On: 10/13/2018 04:11    EKG: Not performed.    Assessment/Plan   1. Intractable abdominal pain; chronic pancreatitis with pseudocyst  - Presents with several hours of severe upper abdominal pain with N/V similar to his prior experiences with pancreatitis  - There is a pancreatic mass on CT that appears to be a 5 cm pseudocyst    - There is no evidence for infection or other complication  - Continue bowel-rest, IVF hydration, and pain-control   2. Alcohol abuse  - Does not appear intoxicated or withdrawing on admission  - Monitor with CIWA, supplement vitamins     DVT prophylaxis: SCD's  Code Status: Full  Family Communication: Discussed with patient  Consults called: None Admission status: Observation     Briscoe Deutscher, MD [facility] Pager [phone]  If 7PM-7AM, please contact night-coverage [email or web address] Password [facility]  10/13/2018, 5:14 AM

## 2018-10-13 NOTE — Progress Notes (Signed)
  PROGRESS NOTE  [name] [name] BHA:[id number] DOB: [birth year] DOA: 10/12/2018 PCP: Patient, No Pcp Per  Brief History    [name] [name] is a 43 y.o. male with medical history significant for alcohol abuse and chronic pancreatitis, now presenting to the emergency department for evaluation of severe upper abdominal pain and nausea.  Patient reports that he had stopped drinking alcohol for several years and had not experienced any episodes of pancreatitis during that interval, but unfortunately returned to excessive alcohol use recently with a binge over the past couple days, and then development of severe upper abdominal pain last night.  Patient reports fairly acute onset of severe, sharp, nonradiating pain in the upper abdomen approximately 8 hours ago.  This has been associated with nausea and recurrent episodes of nonbloody emesis.  He denies any fevers or chills.  Consultants  . None  Procedures  . None  Antibiotics  . None  Interval History/Subjective  The patient was admitted to a medical bed. He has been kept NPO. He has received IV fluids, pain medication, and antiemetics. He is also on a CIWA protocol for ETOH withdrawal.   Objective   Vitals:  Vitals:   10/13/18 0641 10/13/18 1258  BP: (!) 186/91 (!) 173/92  Pulse: 73 63  Resp: 20   Temp: 98 F (36.7 C)   SpO2: 100% 99%    Exam:  Constitutional:  . Awake, alert, and oriented x 3. No acute distress. Respiratory:  . No increased work of breathing. . No wheezes, rales, or rhonchi.  . No tactile fremitus. Cardiovascular:  . Regular, rate, and rhythm. . No murmurs, ectopy, or gallups. . No lateral PMI. No thrills. Abdomen:  . Abdomen is soft, non-distended. Non-tender. . Hypoactive bowel sounds. . No organomegaly, masses, or hernias are appreciated. Musculoskeletal:  . No cyanosis, clubbing or edema Skin:  . No rashes, lesions, ulcers . palpation of skin: no induration or nodules Neurologic:  . CN 2-12 intact .  Sensation all 4 extremities intact Psychiatric:  . Mental status o Mood, affect appropriate o Orientation to person, place, time  . judgment and insight appear intact     I have personally reviewed the following:   Today's Data  . 10/12/2018 CBC, CMP . Vitals  Imaging  . CT abdomen and pelvis Scheduled Meds: . folic acid  1 mg Oral Daily  . LORazepam  0-4 mg Intravenous Q6H   Followed by  . [START ON 10/15/2018] LORazepam  0-4 mg Intravenous Q12H  . multivitamin with minerals  1 tablet Oral Daily  . thiamine  100 mg Oral Daily   Or  . thiamine  100 mg Intravenous Daily   Continuous Infusions: . sodium chloride 125 mL/hr at 10/13/18 1528    Principal Problem:   Acute on chronic pancreatitis (HCC) Active Problems:   Alcohol abuse  A & P   Acute on chronic pancreatitis: The patient is currently NPO. Advance to clears. The patient is receiving pain control, antiemetics, and IV fluids.  Nausea and vomiting: Improving. Advance diet.  Alcohol abuse/dependence: The patient is on a CIWA protocol. Pt with ETOH level of 181 on admission.  DVT prophylaxis: SCD's Code Status: Full Code Family Communication: None available Disposition Plan: Home  Josep Luviano, DO [facility] Direct contact: see [email or web address]  7PM-7AM contact night coverage as above 10/13/2018, 4:12 PM  LOS: 0 days    LOS: 0 days

## 2018-10-14 LAB — CBC WITH DIFFERENTIAL/PLATELET
Abs Immature Granulocytes: 0.02 10*3/uL (ref 0.00–0.07)
Basophils Absolute: 0 10*3/uL (ref 0.0–0.1)
Basophils Relative: 0 %
EOS PCT: 1 %
Eosinophils Absolute: 0 10*3/uL (ref 0.0–0.5)
HCT: 39.5 % (ref 39.0–52.0)
Hemoglobin: 13 g/dL (ref 13.0–17.0)
Immature Granulocytes: 0 %
Lymphocytes Relative: 37 %
Lymphs Abs: 2.8 10*3/uL (ref 0.7–4.0)
MCH: 32.3 pg (ref 26.0–34.0)
MCHC: 32.9 g/dL (ref 30.0–36.0)
MCV: 98 fL (ref 80.0–100.0)
Monocytes Absolute: 0.6 10*3/uL (ref 0.1–1.0)
Monocytes Relative: 8 %
Neutro Abs: 4.1 10*3/uL (ref 1.7–7.7)
Neutrophils Relative %: 54 %
PLATELETS: 167 10*3/uL (ref 150–400)
RBC: 4.03 MIL/uL — ABNORMAL LOW (ref 4.22–5.81)
RDW: 12.9 % (ref 11.5–15.5)
WBC: 7.6 10*3/uL (ref 4.0–10.5)
nRBC: 0 % (ref 0.0–0.2)

## 2018-10-14 LAB — COMPREHENSIVE METABOLIC PANEL
ALBUMIN: 3.6 g/dL (ref 3.5–5.0)
ALT: 48 U/L — ABNORMAL HIGH (ref 0–44)
AST: 47 U/L — AB (ref 15–41)
Alkaline Phosphatase: 57 U/L (ref 38–126)
Anion gap: 6 (ref 5–15)
BUN: 6 mg/dL (ref 6–20)
CO2: 26 mmol/L (ref 22–32)
CREATININE: 0.78 mg/dL (ref 0.61–1.24)
Calcium: 8.9 mg/dL (ref 8.9–10.3)
Chloride: 104 mmol/L (ref 98–111)
GFR calc Af Amer: >60 mL/min (ref >60)
GFR calc non Af Amer: >60 mL/min (ref >60)
Glucose, Bld: 111 mg/dL — ABNORMAL HIGH (ref 70–99)
Potassium: 4 mmol/L (ref 3.5–5.1)
Sodium: 136 mmol/L (ref 135–145)
Total Bilirubin: 2.2 mg/dL — ABNORMAL HIGH (ref 0.3–1.2)
Total Protein: 7.1 g/dL (ref 6.5–8.1)

## 2018-10-14 LAB — LIPASE, BLOOD: Lipase: 19 U/L (ref 11–51)

## 2018-10-14 LAB — GLUCOSE, CAPILLARY: Glucose-Capillary: 120 mg/dL — ABNORMAL HIGH (ref 70–99)

## 2018-10-14 LAB — HIV ANTIBODY (ROUTINE TESTING W REFLEX): HIV Screen 4th Generation wRfx: NONREACTIVE

## 2018-10-14 NOTE — Progress Notes (Addendum)
PROGRESS NOTE  [name] [name] PJS:[id number] DOB: 1976-04-27 DOA: 10/12/2018 PCP: Patient, No Pcp Per  Brief History    [name] [name] is a 43 y.o. male with medical history significant for alcohol abuse and chronic pancreatitis, now presenting to the emergency department for evaluation of severe upper abdominal pain and nausea.  Patient reports that he had stopped drinking alcohol for several years and had not experienced any episodes of pancreatitis during that interval, but unfortunately returned to excessive alcohol use recently with a binge over the past couple days, and then development of severe upper abdominal pain last night.  Patient reports fairly acute onset of severe, sharp, nonradiating pain in the upper abdomen approximately 8 hours ago.  This has been associated with nausea and recurrent episodes of nonbloody emesis.  He denies any fevers or chills.  Consultants  . None  Procedures  . None  Antibiotics  . None  Interval History/Subjective  The patient was admitted to a medical bed. He has been kept NPO. He has received IV fluids, pain medication, and antiemetics. He is also on a CIWA protocol for ETOH withdrawal.   Objective  S: The patient continues to complain of epigastric abdominal pain. He is only able to take in sips of clear beverages. No interest in advancing diet. Vitals:  Vitals:   10/13/18 2326 10/14/18 0506  BP: (!) 158/86 (!) 146/75  Pulse: 70 70  Resp: 17 19  Temp: 98.3 F (36.8 C) 97.9 F (36.6 C)  SpO2: 99% 96%    Exam:  Constitutional:  . Awake, alert, and oriented x 3. No acute distress. Respiratory:  . No increased work of breathing. . No wheezes, rales, or rhonchi.  . No tactile fremitus. Cardiovascular:  . Regular, rate, and rhythm. . No murmurs, ectopy, or gallups. . No lateral PMI. No thrills. Abdomen:  . Abdomen is soft, non-distended. Positive for tenderness in the epigastrum. Marland Kitchen Hypoactive bowel sounds. . No organomegaly, masses, or  hernias are appreciated. Musculoskeletal:  . No cyanosis, clubbing or edema Skin:  . No rashes, lesions, ulcers . palpation of skin: no induration or nodules Neurologic:  . CN 2-12 intact . Sensation all 4 extremities intact Psychiatric:  . Mental status o Mood, affect appropriate o Orientation to person, place, time  . judgment and insight appear intact     I have personally reviewed the following:   Today's Data  . 10/12/2018 CBC, CMP . Vitals  Imaging  . CT abdomen and pelvis Scheduled Meds: . folic acid  1 mg Oral Daily  . LORazepam  0-4 mg Intravenous Q6H   Followed by  . [START ON 10/15/2018] LORazepam  0-4 mg Intravenous Q12H  . multivitamin with minerals  1 tablet Oral Daily  . thiamine  100 mg Oral Daily   Or  . thiamine  100 mg Intravenous Daily   Continuous Infusions:   Principal Problem:   Acute on chronic pancreatitis (HCC) Active Problems:   Alcohol abuse  A & P   Acute on chronic pancreatitis: The patient is currently NPO. The patient is receiving pain control, antiemetics, and IV fluids. Will hold off on advancing diet as the patient is having pain with just sips of a beverage.  Nausea and vomiting: he had emesis x 1 this morning.  Alcohol abuse/dependence: The patient is on a CIWA protocol. Pt with ETOH level of 181 on admission. Monitor.  DVT prophylaxis: SCD's Code Status: Full Code Family Communication: None available Disposition Plan: Home  Sylvia Kondracki,  DO [facility] Direct contact: see [email or web address]  7PM-7AM contact night coverage as above 10/13/2018, 4:12 PM  LOS: 0 days    LOS: 1 day   ADDENDUM:  The patient is demanding a regular diet.

## 2018-10-15 LAB — COMPREHENSIVE METABOLIC PANEL
ALT: 41 U/L (ref 0–44)
AST: 41 U/L (ref 15–41)
Albumin: 3.5 g/dL (ref 3.5–5.0)
Alkaline Phosphatase: 57 U/L (ref 38–126)
Anion gap: 7 (ref 5–15)
BUN: 11 mg/dL (ref 6–20)
CO2: 28 mmol/L (ref 22–32)
Calcium: 9.5 mg/dL (ref 8.9–10.3)
Chloride: 100 mmol/L (ref 98–111)
Creatinine, Ser: 0.76 mg/dL (ref 0.61–1.24)
GFR calc Af Amer: >60 mL/min (ref >60)
GFR calc non Af Amer: >60 mL/min (ref >60)
Glucose, Bld: 129 mg/dL — ABNORMAL HIGH (ref 70–99)
Potassium: 4.3 mmol/L (ref 3.5–5.1)
Sodium: 135 mmol/L (ref 135–145)
TOTAL PROTEIN: 6.9 g/dL (ref 6.5–8.1)
Total Bilirubin: 1.1 mg/dL (ref 0.3–1.2)

## 2018-10-15 LAB — CBC WITH DIFFERENTIAL/PLATELET
Abs Immature Granulocytes: 0.01 10*3/uL (ref 0.00–0.07)
BASOS ABS: 0 10*3/uL (ref 0.0–0.1)
Basophils Relative: 0 %
EOS PCT: 1 %
Eosinophils Absolute: 0.1 10*3/uL (ref 0.0–0.5)
HCT: 38 % — ABNORMAL LOW (ref 39.0–52.0)
Hemoglobin: 12.5 g/dL — ABNORMAL LOW (ref 13.0–17.0)
Immature Granulocytes: 0 %
LYMPHS PCT: 45 %
Lymphs Abs: 2.8 10*3/uL (ref 0.7–4.0)
MCH: 32.6 pg (ref 26.0–34.0)
MCHC: 32.9 g/dL (ref 30.0–36.0)
MCV: 99.2 fL (ref 80.0–100.0)
Monocytes Absolute: 0.7 10*3/uL (ref 0.1–1.0)
Monocytes Relative: 11 %
NRBC: 0 % (ref 0.0–0.2)
Neutro Abs: 2.6 10*3/uL (ref 1.7–7.7)
Neutrophils Relative %: 43 %
Platelets: 172 10*3/uL (ref 150–400)
RBC: 3.83 MIL/uL — ABNORMAL LOW (ref 4.22–5.81)
RDW: 13.1 % (ref 11.5–15.5)
WBC: 6.2 10*3/uL (ref 4.0–10.5)

## 2018-10-15 LAB — GLUCOSE, CAPILLARY: Glucose-Capillary: 117 mg/dL — ABNORMAL HIGH (ref 70–99)

## 2018-10-15 MED ORDER — ADULT MULTIVITAMIN W/MINERALS CH: 1.0000 | ORAL_TABLET | Freq: Every day | ORAL | 0 refills | Status: AC

## 2018-10-15 MED ORDER — FOLIC ACID 1 MG PO TABS: 1.0000 mg | ORAL_TABLET | Freq: Every day | ORAL | 0 refills | Status: AC

## 2018-10-15 MED ORDER — HYDROMORPHONE HCL 1 MG/ML IJ SOLN
0.5000 mg | INTRAMUSCULAR | Status: DC | PRN
  Administered 2018-10-15: 0.5 mg via INTRAVENOUS
  Filled 2018-10-15: qty 0.5

## 2018-10-15 MED ORDER — ACETAMINOPHEN 325 MG PO TABS: 650.0000 mg | ORAL_TABLET | Freq: Four times a day (QID) | ORAL | 0 refills | Status: AC | PRN

## 2018-10-15 MED ORDER — THIAMINE HCL 100 MG PO TABS: 100.0000 mg | ORAL_TABLET | Freq: Every day | ORAL | 0 refills | Status: AC

## 2018-10-15 NOTE — Discharge Summary (Signed)
Physician Discharge Summary  [name] [name] ZOX:[id number] DOB: [birth year] DOA: 10/12/2018  PCP: Patient, No Pcp Per  Admit date: 10/12/2018 Discharge date: 10/15/2018  Recommendations for Outpatient Follow-up:  1. Avoid alcohol.   Discharge Diagnoses: Principal diagnosis is #1 1. Acute on chronic pancreatitis due to alcohol 2. Nausea and vomiting 3. Alcohol abuse, dependence  Discharge Condition: Fair Disposition: Home  Diet recommendation: Regular diet.  There were no vitals filed for this visit.  History of present illness:  [name] [name] is a 43 y.o. male with medical history significant for alcohol abuse and chronic pancreatitis, now presenting to the emergency department for evaluation of severe upper abdominal pain and nausea.  Patient reports that he had stopped drinking alcohol for several years and had not experienced any episodes of pancreatitis during that interval, but unfortunately returned to excessive alcohol use recently with a binge over the past couple days, and then development of severe upper abdominal pain last night.  Patient reports fairly acute onset of severe, sharp, nonradiating pain in the upper abdomen approximately 8 hours ago.  This has been associated with nausea and recurrent episodes of nonbloody emesis.  He denies any fevers or chills.  Hospital Course:  The patient was admitted to a medical bed. He has been kept NPO. He has received IV fluids, pain medication, and antiemetics. He is also on a CIWA protocol for ETOH withdrawal. The patient's diet was gradually advanced. This morning he has been tolerating a regular diet and states that his pain is much better. He wants to go home.  Today's assessment: S: The patient is awake, alert, and oriented x 3. No acute distress. O: Vitals:  Vitals:   10/14/18 2107 10/15/18 0600  BP: (!) 152/91 121/85  Pulse: 70 66  Resp: 19 17  Temp: 97.9 F (36.6 C) 97.9 F (36.6 C)  SpO2: 100% 99%    Constitutional:   . The patient is awake, alert, and oriented x 3. No acute distress. Respiratory:  . No wheezes, rales, or rhonchi. . No tactile fremitus. . No increased work of breathing.  Cardiovascular:  . Regular rate and rhythm. . No murmurs, ectopy, or gallups. . No lateral PMI. No thrills. Abdomen:  . Abdomen is soft, non-tender, non-distended. . Normoactive bowel sounds. . No hernias, masses, or organomegaly. Musculoskeletal:  o No cyanosis, clubbing or edema. Skin:  . No rashes, lesions, ulcers . palpation of skin: no induration or nodules Neurologic:  . CN 2-12 intact . Sensation all 4 extremities intact Psychiatric:  . judgement and insight appear normal . Mental status o Mood, affect appropriate o Orientation to person, place, time  Discharge Instructions  Discharge Instructions    Activity as tolerated - No restrictions   Complete by:  As directed    Call MD for:  persistant nausea and vomiting   Complete by:  As directed    Call MD for:  severe uncontrolled pain   Complete by:  As directed    Diet general   Complete by:  As directed    Discharge instructions   Complete by:  As directed    Avoid alcohol. Find outpatient alcohol rehab to help with abstinence.   Increase activity slowly   Complete by:  As directed      Allergies as of 10/15/2018      Reactions   Hydromorphone Hcl    REACTION: itching      Medication List    STOP taking these medications   ibuprofen 200 MG  tablet Commonly known as:  ADVIL,MOTRIN     TAKE these medications   acetaminophen 325 MG tablet Commonly known as:  TYLENOL Take 2 tablets (650 mg total) by mouth every 6 (six) hours as needed for mild pain (or Fever >/= 101).   folic acid 1 MG tablet Commonly known as:  FOLVITE Take 1 tablet (1 mg total) by mouth daily. Start taking on:  October 16, 2018   hydroxypropyl methylcellulose / hypromellose 2.5 % ophthalmic solution Commonly known as:  ISOPTO TEARS / GONIOVISC Place 1 drop  into both eyes 3 (three) times daily as needed for dry eyes.   multivitamin with minerals Tabs tablet Take 1 tablet by mouth daily. Start taking on:  October 16, 2018   oxyCODONE-acetaminophen 5-325 MG tablet Commonly known as:  PERCOCET/ROXICET Take 1 tablet by mouth every 4 (four) hours as needed for severe pain.   thiamine 100 MG tablet Take 1 tablet (100 mg total) by mouth daily. Start taking on:  October 16, 2018      Allergies  Allergen Reactions  . Hydromorphone Hcl     REACTION: itching    The results of significant diagnostics from this hospitalization (including imaging, microbiology, ancillary and laboratory) are listed below for reference.    Significant Diagnostic Studies: Ct Abdomen Pelvis W Contrast  Result Date: 10/13/2018 CLINICAL DATA:  Abdominal pain with nausea and vomiting. History of chronic pancreatitis EXAM: CT ABDOMEN AND PELVIS WITH CONTRAST TECHNIQUE: Multidetector CT imaging of the abdomen and pelvis was performed using the standard protocol following bolus administration of intravenous contrast. CONTRAST:  OMNIPAQUE IOHEXOL 300 MG/ML  SOLN COMPARISON:  05/01/2008 FINDINGS: Lower chest:  No contributory findings. Hepatobiliary: Tiny low-density in the right liver, likely cyst.No evidence of cholelithiasis. Mild intrahepatic bile duct dilatation likely related to mass effect on the CBD at the pancreatic head. Pancreas: Chronic calcified pancreatitis with generalized coarse calcification and main duct intermittent dilatation. There is a partially high-density mass in the pancreatic head and uncinate process measuring up to 5 cm. This mass has hazy high internal density likely reflects a hemorrhagic pseudocyst. No surrounding inflammation to indicate acute pancreatitis Spleen: Unremarkable. Adrenals/Urinary Tract: Negative adrenals. No hydronephrosis or stone. Unremarkable bladder. Stomach/Bowel: No obstruction. No evidence of bowel inflammation, including  appendicitis Vascular/Lymphatic: No acute vascular abnormality. Extensive and age advanced atherosclerosis with high-grade left common iliac stenosis on the left no mass or adenopathy. Reproductive:Negative. Other: No ascites or pneumoperitoneum. Musculoskeletal: No acute abnormalities. IMPRESSION: 1. Chronic pancreatitis. A 5 cm pancreatic head/uncinate mass that is likely a pseudocyst. Internal high-density suggest hemorrhagic contents. 2. Mild bile duct dilatation above the pancreatic mass. 3. Advanced atherosclerosis, significantly age advanced. Electronically Signed   By: Marnee Spring M.D.   On: 10/13/2018 04:11    Microbiology: No results found for this or any previous visit (from the past 240 hour(s)).   Labs: Basic Metabolic Panel: Recent Labs  Lab 10/12/18 2317 10/14/18 0523 10/15/18 0520  NA 135 136 135  K 4.2 4.0 4.3  CL 104 104 100  CO2 24 26 28   GLUCOSE 101* 111* 129*  BUN 11 6 11   CREATININE 0.88 0.78 0.76  CALCIUM 8.7* 8.9 9.5   Liver Function Tests: Recent Labs  Lab 10/12/18 2317 10/14/18 0523 10/15/18 0520  AST 69* 47* 41  ALT 64* 48* 41  ALKPHOS 71 57 57  BILITOT 0.5 2.2* 1.1  PROT 7.5 7.1 6.9  ALBUMIN 4.0 3.6 3.5   Recent Labs  Lab 10/12/18  2317 10/14/18 0523  LIPASE 25 19   No results for input(s): AMMONIA in the last 168 hours. CBC: Recent Labs  Lab 10/12/18 2318 10/14/18 0523 10/15/18 0520  WBC 7.2 7.6 6.2  NEUTROABS 2.4 4.1 2.6  HGB 13.1 13.0 12.5*  HCT 39.7 39.5 38.0*  MCV 97.3 98.0 99.2  PLT 197 167 172   Cardiac Enzymes: No results for input(s): CKTOTAL, CKMB, CKMBINDEX, TROPONINI in the last 168 hours. BNP: BNP (last 3 results) No results for input(s): BNP in the last 8760 hours.  ProBNP (last 3 results) No results for input(s): PROBNP in the last 8760 hours.  CBG: Recent Labs  Lab 10/13/18 0831 10/14/18 0740 10/15/18 0735  GLUCAP 104* 120* 117*    Principal Problem:   Acute on chronic pancreatitis (HCC) Active  Problems:   Alcohol abuse   Time coordinating discharge: 38 minutes.  Signed:        Dulcie Gammon, DO [facility]  10/15/2018, 2:40 PM

## 2018-10-15 NOTE — Progress Notes (Signed)
Pts IV removed with a clean and dry dressing intact. Pt given D/C instructions along with printed prescription. Pt taken to the front lobby by hospital staff to await ride. Pt is alert and orientated with no complaints of pain at the time of d/c.

## 2018-12-21 ENCOUNTER — Encounter (HOSPITAL_COMMUNITY): Payer: Self-pay

## 2018-12-21 ENCOUNTER — Other Ambulatory Visit: Payer: Self-pay

## 2018-12-21 ENCOUNTER — Emergency Department (HOSPITAL_COMMUNITY): Payer: Self-pay

## 2018-12-21 ENCOUNTER — Emergency Department (HOSPITAL_COMMUNITY)
Admission: EM | Admit: 2018-12-21 | Discharge: 2018-12-21 | Disposition: A | Discharge status: Home / Self Care | Payer: Self-pay | Attending: Emergency Medicine | Admitting: Emergency Medicine

## 2018-12-21 DIAGNOSIS — R079 Chest pain, unspecified: Secondary | ICD-10-CM | POA: Insufficient documentation

## 2018-12-21 DIAGNOSIS — F17210 Nicotine dependence, cigarettes, uncomplicated: Secondary | ICD-10-CM | POA: Insufficient documentation

## 2018-12-21 DIAGNOSIS — Z79899 Other long term (current) drug therapy: Secondary | ICD-10-CM | POA: Insufficient documentation

## 2018-12-21 DIAGNOSIS — I10 Essential (primary) hypertension: Secondary | ICD-10-CM | POA: Insufficient documentation

## 2018-12-21 LAB — BASIC METABOLIC PANEL
Anion gap: 9 (ref 5–15)
BUN: 14 mg/dL (ref 6–20)
CO2: 26 mmol/L (ref 22–32)
Calcium: 9.4 mg/dL (ref 8.9–10.3)
Chloride: 104 mmol/L (ref 98–111)
Creatinine, Ser: 0.83 mg/dL (ref 0.61–1.24)
GFR calc Af Amer: >60 mL/min (ref >60)
GFR calc non Af Amer: >60 mL/min (ref >60)
Glucose, Bld: 112 mg/dL — ABNORMAL HIGH (ref 70–99)
Potassium: 4.3 mmol/L (ref 3.5–5.1)
Sodium: 139 mmol/L (ref 135–145)

## 2018-12-21 LAB — CBC
HCT: 41.6 % (ref 39.0–52.0)
Hemoglobin: 13.7 g/dL (ref 13.0–17.0)
MCH: 31.9 pg (ref 26.0–34.0)
MCHC: 32.9 g/dL (ref 30.0–36.0)
MCV: 97 fL (ref 80.0–100.0)
Platelets: 269 10*3/uL (ref 150–400)
RBC: 4.29 MIL/uL (ref 4.22–5.81)
RDW: 13.3 % (ref 11.5–15.5)
WBC: 8 10*3/uL (ref 4.0–10.5)
nRBC: 0 % (ref 0.0–0.2)

## 2018-12-21 LAB — TROPONIN I: Troponin I: <0.03 ng/mL (ref <0.03)

## 2018-12-21 MED ORDER — IBUPROFEN 800 MG PO TABS: 800.0000 mg | ORAL_TABLET | Freq: Three times a day (TID) | ORAL | 0 refills | Status: AC

## 2018-12-21 MED ORDER — IBUPROFEN 800 MG PO TABS
800.0000 mg | ORAL_TABLET | Freq: Once | ORAL | Status: AC
  Administered 2018-12-21: 800 mg via ORAL
  Filled 2018-12-21: qty 1

## 2018-12-21 MED ORDER — SODIUM CHLORIDE 0.9% FLUSH: 3.0000 mL | Freq: Once | INTRAVENOUS | Status: DC

## 2018-12-21 NOTE — ED Triage Notes (Addendum)
Patient c/o constant left chest pain and SOB since 1200 today. Patient denies any diaphoresis, N/V.  patient reported that he did cocaine last night.

## 2018-12-21 NOTE — ED Provider Notes (Signed)
[facility]-EMERGENCY DEPT Provider Note   CSN: [id number] Arrival date & time: 12/21/18  1747    History   Chief Complaint Chief Complaint  Patient presents with  . Chest Pain    HPI [name] [name] is a 43 y.o. male.     The history is provided by the patient.  Chest Pain  Pain location:  Substernal area Pain quality: aching   Pain radiates to:  Does not radiate Pain severity:  Mild Onset quality:  Gradual Duration:  1 day Timing:  Intermittent Progression:  Resolved Chronicity:  New Context: drug use (cocaine use yesterday)   Relieved by:  Nothing Worsened by:  Nothing Ineffective treatments:  None tried Associated symptoms: no abdominal pain, no altered mental status, no back pain, no cough, no fever, no numbness, no palpitations, no shortness of breath and no vomiting     Past Medical History:  Diagnosis Date  . Pancreatitis     Patient Active Problem List   Diagnosis Date Noted  . Acute on chronic pancreatitis (HCC) 10/13/2018  . Pancreatic pseudocyst   . Alcohol abuse 03/21/2008  . SUBSTANCE ABUSE, MULTIPLE 03/21/2008  . HYPERTENSION 03/21/2008  . PANCREATITIS, CHRONIC 03/21/2008    Past Surgical History:  Procedure Laterality Date  . ANKLE FRACTURE SURGERY    . NO PAST SURGERIES          Home Medications    Prior to Admission medications   Medication Sig Start Date End Date Taking? Authorizing Provider  acetaminophen (TYLENOL) 325 MG tablet Take 2 tablets (650 mg total) by mouth every 6 (six) hours as needed for mild pain (or Fever >/= 101). 10/15/18   Swayze, Ava, DO  folic acid (FOLVITE) 1 MG tablet Take 1 tablet (1 mg total) by mouth daily. 10/16/18   Swayze, Ava, DO  hydroxypropyl methylcellulose / hypromellose (ISOPTO TEARS / GONIOVISC) 2.5 % ophthalmic solution Place 1 drop into both eyes 3 (three) times daily as needed for dry eyes.    [provider]  ibuprofen (ADVIL) 800 MG tablet Take 1 tablet (800 mg  total) by mouth 3 (three) times daily for 15 doses. 12/21/18 12/26/18  Virgina Norfolkuratolo, Alcides Nutting, DO  Multiple Vitamin (MULTIVITAMIN WITH MINERALS) TABS tablet Take 1 tablet by mouth daily. 10/16/18   Swayze, Ava, DO  oxyCODONE-acetaminophen (PERCOCET/ROXICET) 5-325 MG per tablet Take 1 tablet by mouth every 4 (four) hours as needed for severe pain. Patient not taking: Reported on 10/13/2018 02/06/14   Raeford RazorKohut, Stephen, MD  thiamine 100 MG tablet Take 1 tablet (100 mg total) by mouth daily. 10/16/18   Swayze, Ava, DO    Family History Family History  Family history unknown: Yes    Social History Social History   Tobacco Use  . Smoking status: Current Every Day Smoker    Packs/day: 1.00  . Smokeless tobacco: Never Used  Substance Use Topics  . Alcohol use: Yes    Alcohol/week: 12.0 standard drinks    Types: 12 Cans of beer per week    Comment: daily  . Drug use: Yes    Comment: cocaine     Allergies   Hydromorphone hcl   Review of Systems Review of Systems  Constitutional: Negative for chills and fever.  HENT: Negative for ear pain and sore throat.   Eyes: Negative for pain and visual disturbance.  Respiratory: Negative for cough and shortness of breath.   Cardiovascular: Positive for chest pain. Negative for palpitations.  Gastrointestinal: Negative for abdominal pain and vomiting.  Genitourinary: Negative for dysuria and hematuria.  Musculoskeletal: Negative for arthralgias and back pain.  Skin: Negative for color change and rash.  Neurological: Negative for seizures, syncope and numbness.  All other systems reviewed and are negative.    Physical Exam Updated Vital Signs  ED Triage Vitals  Enc Vitals Group     BP 12/21/18 1759 (!) 157/96     Pulse Rate 12/21/18 1759 80     Resp 12/21/18 1759 15     Temp 12/21/18 1759 98 F (36.7 C)     Temp Source 12/21/18 1759 Oral     SpO2 12/21/18 1759 100 %     Weight 12/21/18 1756 180 lb (81.6 kg)     Height 12/21/18 1756  (1.854  m)     Head Circumference --      Peak Flow --      Pain Score 12/21/18 1755 5     Pain Loc --      Pain Edu? --      Excl. in GC? --     Physical Exam Vitals signs and nursing note reviewed.  Constitutional:      Appearance: He is well-developed.  HENT:     Head: Normocephalic and atraumatic.  Eyes:     Extraocular Movements: Extraocular movements intact.     Conjunctiva/sclera: Conjunctivae normal.     Pupils: Pupils are equal, round, and reactive to light.  Neck:     Musculoskeletal: Normal range of motion and neck supple.  Cardiovascular:     Rate and Rhythm: Normal rate and regular rhythm.     Pulses:          Radial pulses are 2+ on the right side and 2+ on the left side.     Heart sounds: No murmur.  Pulmonary:     Effort: Pulmonary effort is normal. No respiratory distress.     Breath sounds: Normal breath sounds. No decreased breath sounds, wheezing, rhonchi or rales.  Abdominal:     Palpations: Abdomen is soft.     Tenderness: There is no abdominal tenderness.  Musculoskeletal: Normal range of motion.     Right lower leg: No edema.     Left lower leg: Edema present.  Skin:    General: Skin is warm and dry.  Neurological:     Mental Status: He is alert.      ED Treatments / Results  Labs (all labs ordered are listed, but only abnormal results are displayed) Labs Reviewed  BASIC METABOLIC PANEL - Abnormal; Notable for the following components:      Result Value   Glucose, Bld 112 (*)    All other components within normal limits  CBC  TROPONIN I    EKG EKG Interpretation  Date/Time:  Monday December 21 2018 17:57:47 EDT Ventricular Rate:  87 PR Interval:    QRS Duration: 82 QT Interval:  381 QTC Calculation: 459 R Axis:   72 Text Interpretation:  Sinus rhythm RSR' in V1 or V2, probably normal variant Confirmed by Virgina Norfolk [phone]) on 12/21/2018 8:57:34 PM   Radiology Dg Chest 2 View  Result Date: 12/21/2018 CLINICAL DATA:  Central chest  pain. EXAM: CHEST - 2 VIEW COMPARISON:  December 25, 2009 FINDINGS: The heart size and mediastinal contours are within normal limits. Both lungs are clear. The visualized skeletal structures are unremarkable. IMPRESSION: No active cardiopulmonary disease. Electronically Signed   By: Gerome Sam III M.D   On: 12/21/2018 18:52  Procedures Procedures (including critical care time)  Medications Ordered in ED Medications  sodium chloride flush (NS) 0.9 % injection 3 mL (has no administration in time range)  ibuprofen (ADVIL) tablet 800 mg (has no administration in time range)     Initial Impression / Assessment and Plan / ED Course  I have reviewed the triage vital signs and the nursing notes.  Pertinent labs & imaging results that were available during my care of the patient were reviewed by me and considered in my medical decision making (see chart for details).     [name] [name] is a 43 year old male who presents to the ED with chest pain following cocaine abuse yesterday.  Patient normal vitals.  No fever.  EKG overall unremarkable.  No ischemic changes.  Patient currently chest pain-free.  States that he had some pretty persistent pain started last night after cocaine use.  States that he is feeling better now after my evaluation.  Has been in the waiting room and already has lab work and EKG and chest x-ray done.  Patient with normal troponin after having chest pain over 24 hours ago, no need for delta trop as no longer with pain. Chest x-ray showed no signs of pneumonia, pneumothorax, pleural effusion.  Patient states that he snorts cocaine and does not inhale.  Unlikely pneumonitis.  Symptoms not consistent with pericarditis.  No concern for dissection. PERC negative and doubt PE. Patient asymptomatic.  Normal pulses.  No abdominal tenderness. Likely vasospasm. Given education about drug use.  States that he is not ready to stop using drugs or alcohol.  Discharged from the ED in good  condition.  No concern for ACS or other acute event at this time.  This chart was dictated using voice recognition software.  Despite best efforts to proofread,  errors can occur which can change the documentation meaning.    Final Clinical Impressions(s) / ED Diagnoses   Final diagnoses:  Chest pain, unspecified type    ED Discharge Orders         Ordered    ibuprofen (ADVIL) 800 MG tablet  3 times daily     12/21/18 2139           Virgina Norfolk, DO 12/21/18 2139
